# Patient Record
Sex: Male | Born: 1970 | Race: White | Hispanic: No | Marital: Single | Smoking: Current every day smoker
Health system: Southern US, Community
[De-identification: ages and names within clinical notes are randomized; demographics above are authoritative.]

## PROBLEM LIST (undated history)

## (undated) DIAGNOSIS — S32009A Unspecified fracture of unspecified lumbar vertebra, initial encounter for closed fracture: Secondary | ICD-10-CM

## (undated) NOTE — Progress Notes (Signed)
 Formatting of this note is different from the original. Medical record reviewed. CM met with the pt to complete assessment. The pt lives with his significant other and reports complete independence in all activities. Prior to admission he required no assistive devices for ambulation. The pt is form the [place], [place] area and prefers to follow up with Ortho there. CM provided a list of Ortho physician's in his local area under [facility] umbrella, explained he may be required to provide payment at time of appointment. CM could not make contact today, offices closed. Encouraged pt to call ASAP on his return to his local area to obtain an appointment. If he is unsuccessful he has been instructed to contact Dr. Catha office for follow up.  The pt's films will be placed on a CD and medical information provided for the physician in his local area. Awaiting brace and ambulation prior to discharge.   08/20/15 1459  Discharge Assessment  Discharge Screening No identified indicators  Discharge Screening Result Case Management face to face assessment recommended  Support Systems Family members;Friends/neighbors;Spouse/significant other  Primary Caregiver/Support Name [facility]  Primary Caregiver/Support Phone Number [phone]  Lives With Significant other  Type of Residence Private residence  Baseline Walking Independent  Baseline Wheelchair mobility Not applicable  Baseline Cognition Intact  Baseline Mode of Retail banker None  Home Bathroom Equipment None  Services Prior to Admission None  Patient Discharge Goal/Expectation Home-patient/family capable  Case Management Face to Face Assessment Complete  (FOR CASE MANAGEMENT STAFF ONLY) Yes  Case Management Financial Assessment Uninsured   Jerel Gaudier RN, MSN, [place] Trauma Nurse Case Manager [phone]  Electronically signed by Jerel Jama Rhett Sharlie, Case Manager at 08/20/2015  3:06 PM EDT
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
## (undated) NOTE — Progress Notes (Signed)
 Formatting of this note might be different from the original. INTERVAL PROGRESS NOTE:  Patient asking to be discharged to home. Evaluated by Spine Surgery and deemed appropriate for discharge with brace. Patient was ambulatory with brace with nursing staff with minimal assistance. His pain was well controlled with flexeril and oxycodone  and he was stable for discharge.  Deward Presto, MD, PGY-1 General Surgery 08/20/2015 17:12 Pager: [phone] Serve [phone]  Cosigned by Elsie FALCON. Powers IV, MD at 08/21/2015  6:33 PM EDT Electronically signed by Deward Butler Presto, MD at 08/20/2015  5:12 PM EDT Electronically signed by Elsie FALCON. Powers IV, MD at 08/21/2015  6:33 PM EDT
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
## 2002-04-20 ENCOUNTER — Encounter: Payer: Self-pay | Admitting: Emergency Medicine

## 2002-04-20 ENCOUNTER — Emergency Department (HOSPITAL_COMMUNITY): Admission: EM | Admit: 2002-04-20 | Discharge: 2002-04-20 | Payer: Self-pay | Admitting: Emergency Medicine

## 2015-09-05 ENCOUNTER — Emergency Department (HOSPITAL_COMMUNITY): Payer: Self-pay

## 2015-09-05 ENCOUNTER — Emergency Department (HOSPITAL_COMMUNITY)
Admission: EM | Admit: 2015-09-05 | Discharge: 2015-09-05 | Disposition: A | Discharge status: Home / Self Care | Payer: Self-pay | Attending: Emergency Medicine | Admitting: Emergency Medicine

## 2015-09-05 ENCOUNTER — Encounter (HOSPITAL_COMMUNITY): Payer: Self-pay | Admitting: Nurse Practitioner

## 2015-09-05 DIAGNOSIS — S32059D Unspecified fracture of fifth lumbar vertebra, subsequent encounter for fracture with routine healing: Secondary | ICD-10-CM

## 2015-09-05 DIAGNOSIS — F17200 Nicotine dependence, unspecified, uncomplicated: Secondary | ICD-10-CM | POA: Insufficient documentation

## 2015-09-05 HISTORY — DX: Unspecified fracture of unspecified lumbar vertebra, initial encounter for closed fracture: S32.009A

## 2015-09-05 MED ORDER — OXYCODONE-ACETAMINOPHEN 5-325 MG PO TABS
1.0000 | ORAL_TABLET | Freq: Once | ORAL | Status: AC
  Administered 2015-09-05: 1 via ORAL
  Filled 2015-09-05: qty 1

## 2015-09-05 MED ORDER — METHOCARBAMOL 500 MG PO TABS
1000.0000 mg | ORAL_TABLET | Freq: Once | ORAL | Status: AC
  Administered 2015-09-05: 1000 mg via ORAL
  Filled 2015-09-05: qty 2

## 2015-09-05 MED ORDER — OXYCODONE-ACETAMINOPHEN 5-325 MG PO TABS: 1.0000 | ORAL_TABLET | Freq: Three times a day (TID) | ORAL | Status: DC | PRN

## 2015-09-05 NOTE — ED Notes (Signed)
Patient transported to CT 
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
## 2015-09-05 NOTE — Discharge Instructions (Signed)
Take pain medication only as directed - This can make you very drowsy - please do not drink alcohol, operate heavy machinery or drive on this medication.  Return to ER for numbness of lower extremity, fever, new or worsening symptoms, any additional concerns.

## 2015-09-05 NOTE — ED Notes (Signed)
PA at bedside.

## 2017-06-16 ENCOUNTER — Emergency Department (HOSPITAL_COMMUNITY)
Admission: EM | Admit: 2017-06-16 | Discharge: 2017-06-16 | Disposition: A | Discharge status: Home / Self Care | Payer: Self-pay | Attending: Emergency Medicine | Admitting: Emergency Medicine

## 2017-06-16 ENCOUNTER — Encounter (HOSPITAL_COMMUNITY): Payer: Self-pay | Admitting: Emergency Medicine

## 2017-06-16 DIAGNOSIS — Y929 Unspecified place or not applicable: Secondary | ICD-10-CM | POA: Insufficient documentation

## 2017-06-16 DIAGNOSIS — Z23 Encounter for immunization: Secondary | ICD-10-CM | POA: Insufficient documentation

## 2017-06-16 DIAGNOSIS — F17200 Nicotine dependence, unspecified, uncomplicated: Secondary | ICD-10-CM | POA: Insufficient documentation

## 2017-06-16 DIAGNOSIS — Y998 Other external cause status: Secondary | ICD-10-CM | POA: Insufficient documentation

## 2017-06-16 DIAGNOSIS — W458XXA Other foreign body or object entering through skin, initial encounter: Secondary | ICD-10-CM | POA: Insufficient documentation

## 2017-06-16 DIAGNOSIS — S6990XA Unspecified injury of unspecified wrist, hand and finger(s), initial encounter: Secondary | ICD-10-CM | POA: Insufficient documentation

## 2017-06-16 DIAGNOSIS — Y9389 Activity, other specified: Secondary | ICD-10-CM | POA: Insufficient documentation

## 2017-06-16 MED ORDER — LIDOCAINE HCL 2 % IJ SOLN
5.0000 mL | Freq: Once | INTRAMUSCULAR | Status: AC
  Administered 2017-06-16: 100 mg via INTRADERMAL
  Filled 2017-06-16: qty 20

## 2017-06-16 MED ORDER — CEPHALEXIN 500 MG PO CAPS: 500.0000 mg | ORAL_CAPSULE | Freq: Four times a day (QID) | ORAL | 0 refills | Status: AC

## 2017-06-16 MED ORDER — TETANUS-DIPHTH-ACELL PERTUSSIS 5-2.5-18.5 LF-MCG/0.5 IM SUSP
0.5000 mL | Freq: Once | INTRAMUSCULAR | Status: AC
  Administered 2017-06-16: 0.5 mL via INTRAMUSCULAR
  Filled 2017-06-16: qty 0.5

## 2017-06-16 NOTE — ED Provider Notes (Signed)
[facility]-EMERGENCY DEPT Provider Note   CSN: [id number] Arrival date & time: 06/16/17  2104     History   Chief Complaint No chief complaint on file.   HPI Chad Greene is a 47 y.o. male.  HPI   Pt is a 47 year old male who presents the ED today complaining of a fishhook stuck into his left ring finger that happened about 4-5 hours ago.  States he attempted to remove it prior to arrival with no success.  States he cut the end of the fishhook off.  Does have decreased sensation to the distal part of the left index finger.  No active bleeding.  States he has good range of motion of the finger.  States pain 8/10 and constant.  Has not tried taking any pain medication prior to arrival.  No other injuries noted and no other symptoms. No h/o DM.  Past Medical History:  Diagnosis Date  . Lumbar vertebral fracture (HCC)     There are no active problems to display for this patient.   History reviewed. No pertinent surgical history.      Home Medications    Prior to Admission medications   Medication Sig Start Date End Date Taking? Authorizing Provider  cephALEXin (KEFLEX) 500 MG capsule Take 1 capsule (500 mg total) by mouth 4 (four) times daily for 5 days. 06/16/17 06/21/17  Ayrianna Mcginniss S, PA-C  ibuprofen (ADVIL,MOTRIN) 200 MG tablet Take 800 mg by mouth every 6 (six) hours as needed for moderate pain.    [provider]  oxyCODONE-acetaminophen (PERCOCET/ROXICET) 5-325 MG tablet Take 1 tablet by mouth every 8 (eight) hours as needed for severe pain. 09/05/15   Ward, Chase PicketJaime Pilcher, PA-C    Family History No family history on file.  Social History Social History   Tobacco Use  . Smoking status: Current Some Day Smoker  Substance Use Topics  . Alcohol use: Yes  . Drug use: No     Allergies   Patient has no known allergies.   Review of Systems Review of Systems  Constitutional: Negative for fever.  Musculoskeletal:       Finger  pain  Skin:       Fishhook in left ring finger     Physical Exam Updated Vital Signs BP 130/88 (BP Location: Right Arm)   Pulse 88   Temp 98.3 F (36.8 C) (Oral)   Resp 17   SpO2 98%   Physical Exam  Constitutional: He is oriented to person, place, and time. He appears well-developed and well-nourished. No distress.  Eyes: Conjunctivae are normal.  Cardiovascular: Normal rate.  Pulmonary/Chest: Effort normal.  Musculoskeletal:  Patient took 2 distal portion of left ring finger.  Brisk cap refill.  Flexion extension intact at the DIP, PIP and MCP joints.  Does report decreased sensation to the distal portion of the left ring finger.  No active bleeding.  Neurological: He is alert and oriented to person, place, and time.  Skin: Skin is warm and dry.     ED Treatments / Results  Labs (all labs ordered are listed, but only abnormal results are displayed) Labs Reviewed - No data to display  EKG None  Radiology No results found.  Procedures .Nerve Block Date/Time: 06/17/2017 12:46 AM Performed by: Karrie Meresouture, Sharonica Kraszewski S, PA-C Authorized by: Karrie Meresouture, Tanieka Pownall S, PA-C   Consent:    Consent obtained:  Verbal   Consent given by:  Patient   Risks discussed:  Infection and nerve damage  Location:    Nerve block body site: finger.   Laterality:  Left Pre-procedure details:    Skin preparation:  2% chlorhexidine Skin anesthesia (see MAR for exact dosages):    Skin anesthesia method:  Local infiltration Procedure details (see MAR for exact dosages):    Block needle gauge:  25 G   Anesthetic injected:  Lidocaine 2% w/o epi Post-procedure details:    Outcome:  Pain relieved   Patient tolerance of procedure:  Tolerated well, no immediate complications   (including critical care time)  Medications Ordered in ED Medications  lidocaine (XYLOCAINE) 2 % (with pres) injection 100 mg (100 mg Intradermal Given 06/16/17 2345)  Tdap (BOOSTRIX) injection 0.5 mL (0.5 mLs Intramuscular Given  06/16/17 2344)     Initial Impression / Assessment and Plan / ED Course  I have reviewed the triage vital signs and the nursing notes.  Pertinent labs & imaging results that were available during my care of the patient were reviewed by me and considered in my medical decision making (see chart for details).    Discussed pt presentation and exam findings with Dr. Ethelle Lyon who personally evaluated the patient and agrees with the plan for discharge on Keflex.   Final Clinical Impressions(s) / ED Diagnoses   Final diagnoses:  Fish hook injury of finger, unspecified laterality, initial encounter   Patient presenting with fishhook stuck in left ring finger.  Digital block performed to the finger and fishhook was removed via push through technique.  Patient already caught fishhook prior to arrival.  Area was cleansed with normal saline and dressed.  Patient was placed on Keflex and advised to follow-up with PCP in a week for reevaluation.  Advised to return for any signs of infection.  Tdap was updated.  All questions answered and patient understands plan and reasons to return.  ED Discharge Orders        Ordered    cephALEXin (KEFLEX) 500 MG capsule  4 times daily     06/16/17 2332       Karrie Meres, PA-C 06/17/17 0049    Cathren Laine, MD 06/21/17 [phone]

## 2017-06-16 NOTE — Discharge Instructions (Addendum)
You were given a prescription for antibiotics. Please take the antibiotic prescription fully.   I have prescribed a new medication for you today. It is important that when you pick the prescription up you discuss the potential interactions of this medication with other medications you are taking, including over the counter medications, with the pharmacists.   This new medication has potential side effects. Be sure to contact your primary care provider or return to the emergency department if you are experiencing new symptoms that you are unable to tolerate after starting the medication. You need to receive medical evaluation immediately if you start to experience blistering of the skin, rash, swelling, or difficulty breathing as these signs could indicate a more serious medication side effect.   Please follow up with your primary care doctor in 1 week for recheck of your wound. Please return to the emergency room immediately if you experience any new or worsening symptoms or any symptoms that indicate worsening infection such as fevers, increased redness/swelling/pain, warmth, or drainage from the affected area.

## 2017-06-16 NOTE — ED Triage Notes (Signed)
Patient reports fishing hook stuck in left ring finger x4 hours. Reports attempting to remove PTA with no success. Movement and sensation to finger.

## 2018-03-07 IMAGING — CT CT L SPINE W/O CM
3 series · 12 of 33 positions shown, 14 images · non-contrast
Comparison: X-ray from earlier today

CLINICAL DATA: Known L5 fracture.  Pain.

EXAM:
CT LUMBAR SPINE WITHOUT CONTRAST
TECHNIQUE: Multidetector CT imaging of the lumbar spine was performed without
intravenous contrast administration. Multiplanar CT image
reconstructions were also generated.

[Series 202: soft tissue, idose (2) · axial · 0.45mm/px · z∈[+390,+614]mm · 4 of 164 slices shown, 5 images]
[im 26/164  soft-tissue]
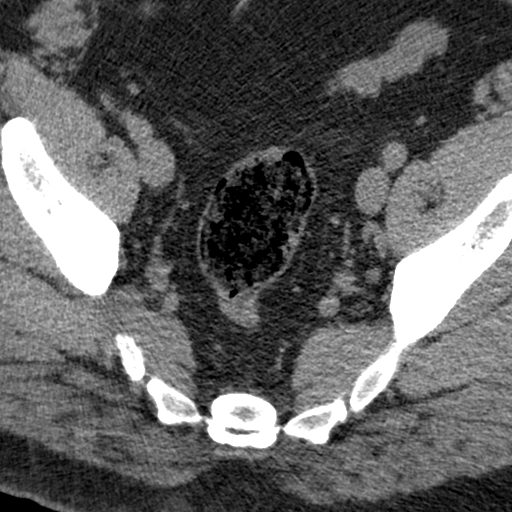
[im 26/164  bone]
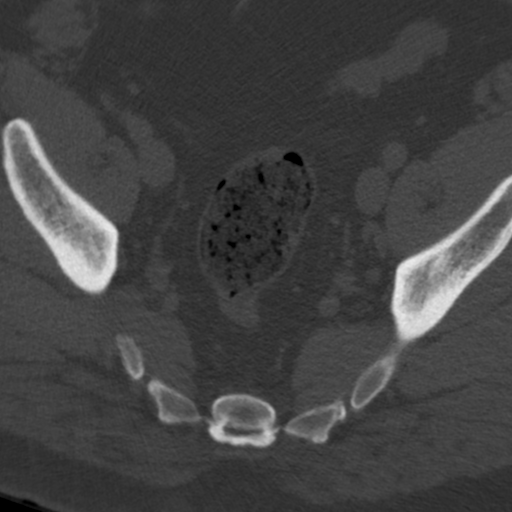
[im 63/164  bone]
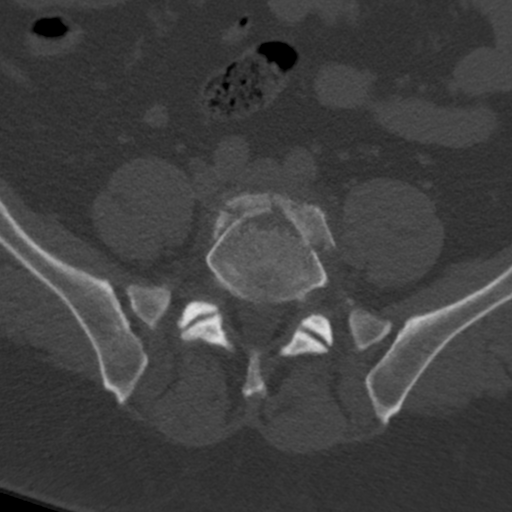
[im 101/164  bone]
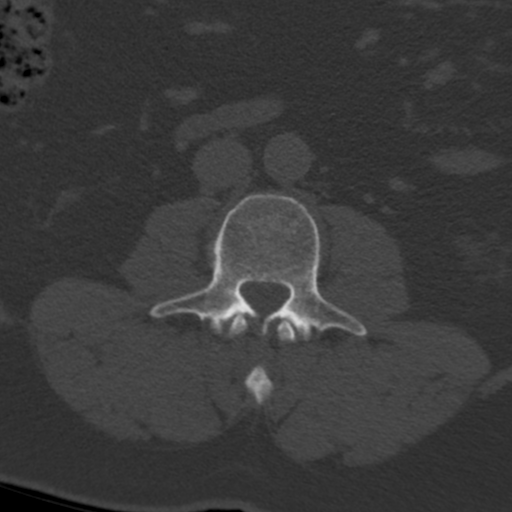
[im 138/164  bone]
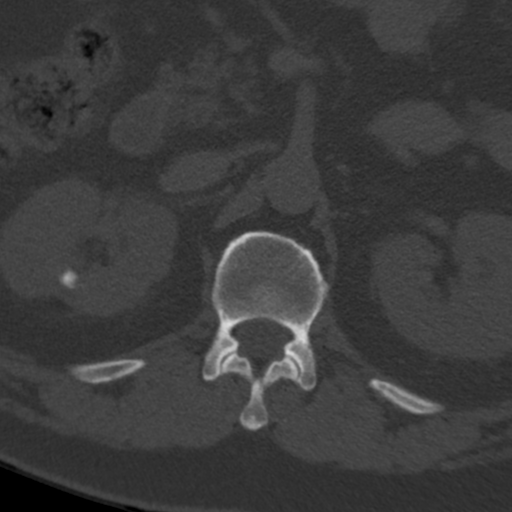

[Series 204: sagittal, idose (2) · sagittal · 0.45mm/px · 5 of 113 slices shown, 6 images]
[im 38/113  bone]
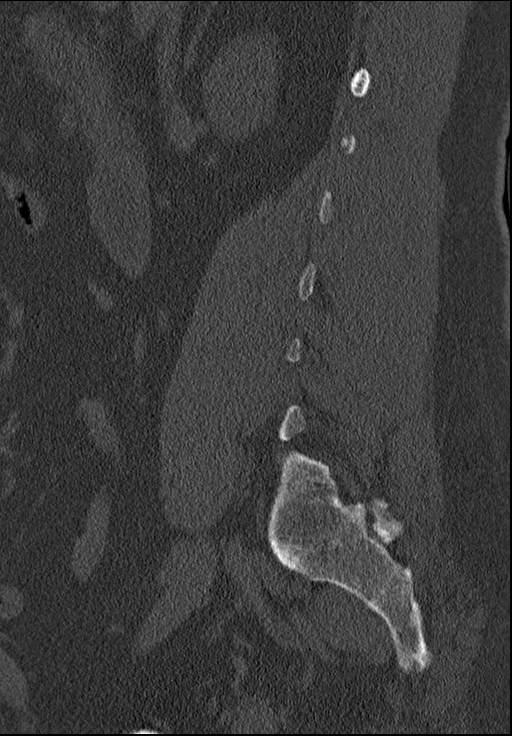
[im 47/113  bone]
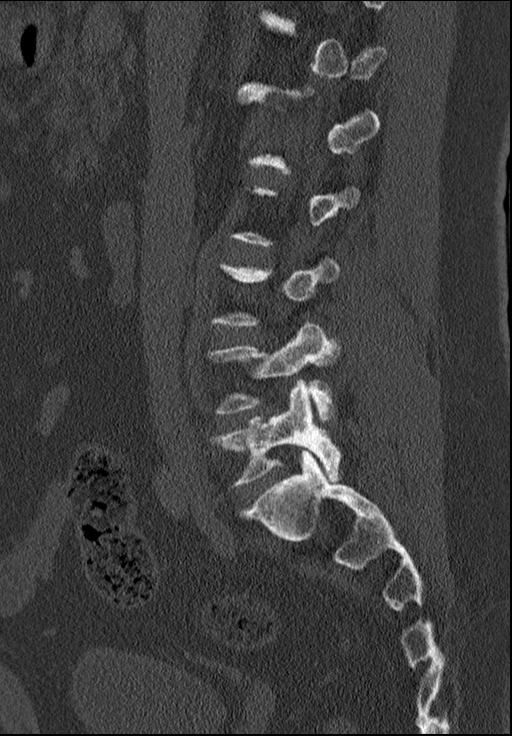
[im 57/113  soft-tissue]
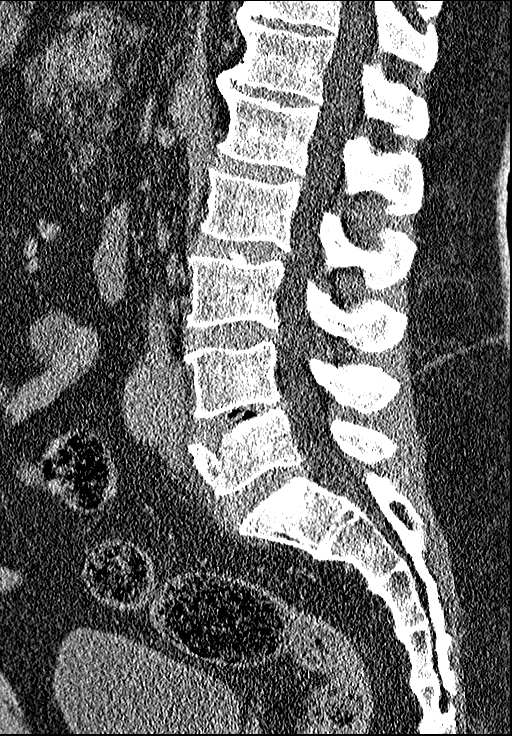
[im 57/113  bone]
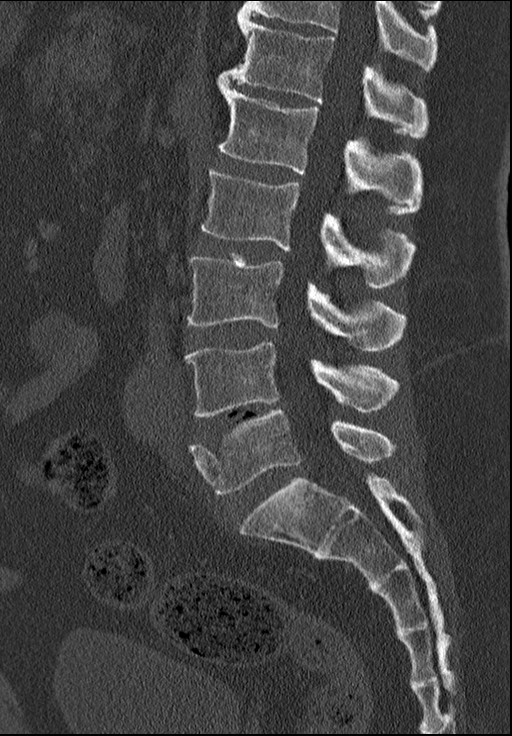
[im 66/113  bone]
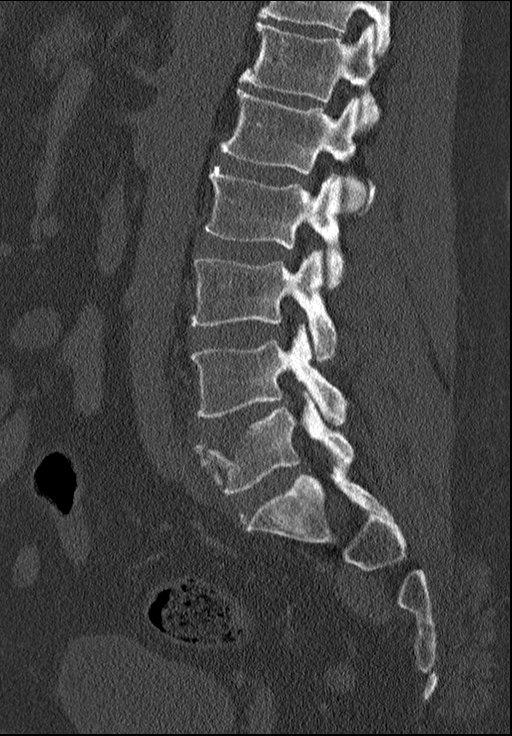
[im 75/113  bone]
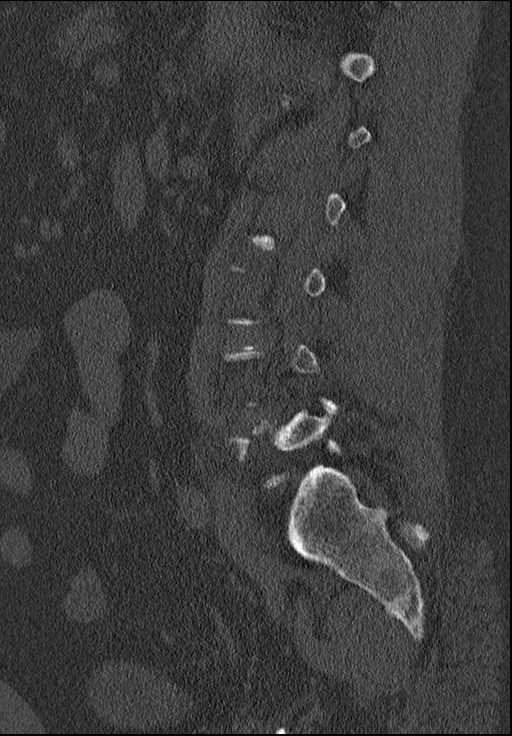

[Series 205: coronal, idose (2) · coronal · 0.45mm/px · 3 of 113 slices shown]
[im 23/113  bone]
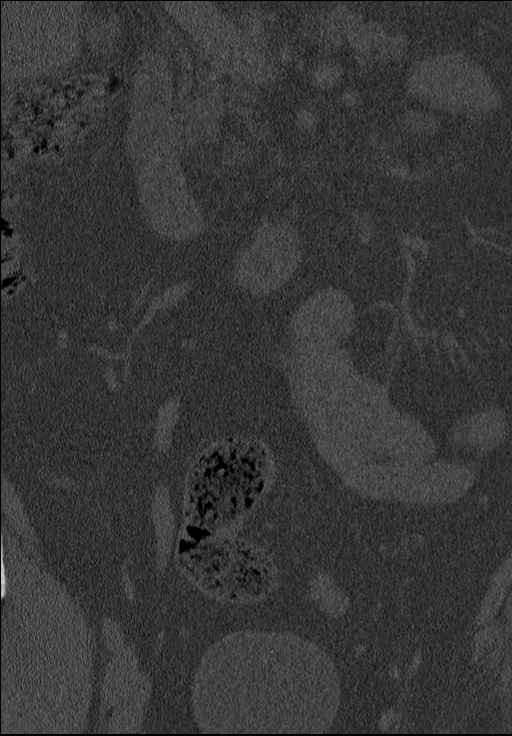
[im 45/113  bone]
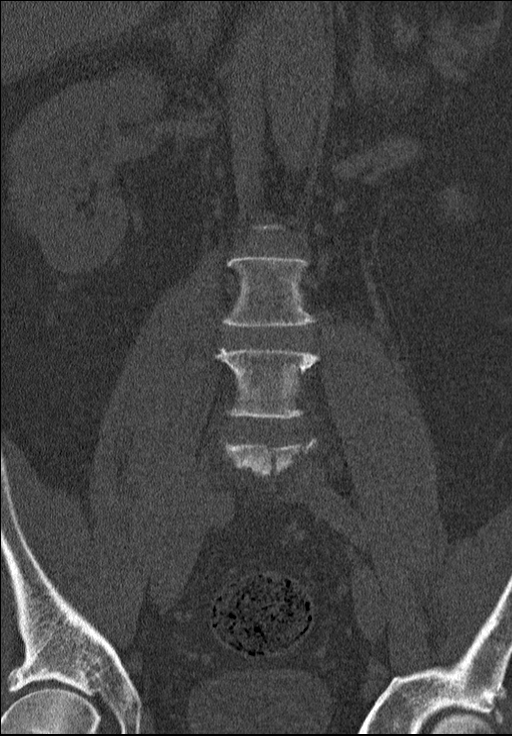
[im 68/113  bone]
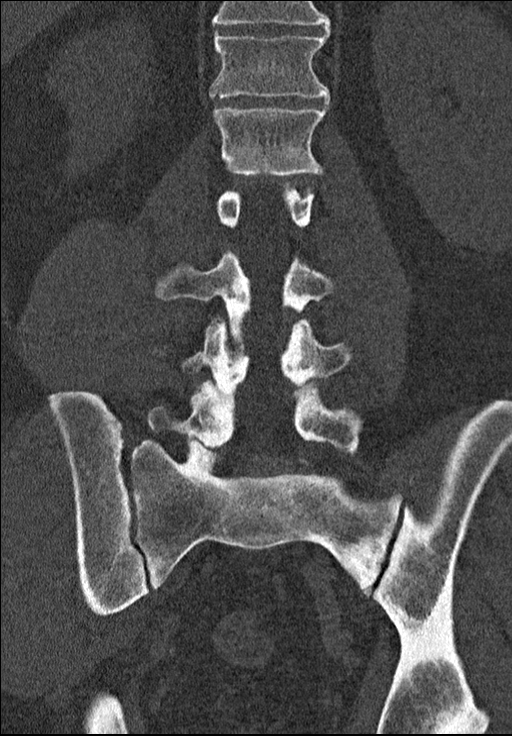

[12 of 33 positions shown; findings below may reference images not displayed]

FINDINGS: There is a complex comminuted fracture of the L5 vertebral body
which is located in the anterior half of the vertebral body with
mild displacement of fracture fragments. The fracture does not
extend to the posterior aspect of the vertebral body and there is no
extension into the posterior elements. Gas in the L4-5 disc space is
likely posttraumatic vacuum disc phenomena. No other fracture seen
in the lumbar spine. Degenerative changes seen at multiple levels
most prominent at T11-12 and T12-L1. There is also a possible
nondisplaced fracture through the left side of the sacrum.

No neural foraminal narrowing seen on this study.

There is a nonobstructive stone in the right kidney measuring 10 mm.
There is a low-attenuation mass in the left adrenal gland most
consistent with an adenoma.
IMPRESSION: 1. Comminuted fracture of the anterior half of the L5 vertebral body
with no extension into the posterior elements and no significant
malalignment. Posttraumatic vacuum disc phenomena seen at L4-5.
2. Possible subtle nondisplaced fracture through the left side of
the sacrum.
3. Nonobstructive right renal stone.
4. Left adrenal nodule, likely an adenoma.

## 2019-01-15 ENCOUNTER — Other Ambulatory Visit: Payer: Self-pay

## 2019-01-15 DIAGNOSIS — Z20822 Contact with and (suspected) exposure to covid-19: Secondary | ICD-10-CM

## 2019-01-16 LAB — NOVEL CORONAVIRUS, NAA: SARS-CoV-2, NAA: NOT DETECTED

## 2019-06-27 ENCOUNTER — Ambulatory Visit: Payer: Self-pay | Attending: Internal Medicine

## 2019-06-27 DIAGNOSIS — Z23 Encounter for immunization: Secondary | ICD-10-CM

## 2019-06-27 NOTE — Progress Notes (Signed)
   Covid-19 Vaccination Clinic  Name:  Chad Greene    MRN: [id number] DOB: 1970-08-18  06/27/2019  [name] was observed post Covid-19 immunization for 15 minutes without incident. He was provided with Vaccine Information Sheet and instruction to access the V-Safe system.   [name] was instructed to call 911 with any severe reactions post vaccine: Marland Kitchen Difficulty breathing  . Swelling of face and throat  . A fast heartbeat  . A bad rash all over body  . Dizziness and weakness   Immunizations Administered    Name Date Dose VIS Date Route   Pfizer COVID-19 Vaccine 06/27/2019  9:33 AM 0.3 mL 02/20/2019 Intramuscular   Manufacturer: [employer], [employer]   Lot: [id number]   NDC: [address]-7

## 2019-07-21 ENCOUNTER — Ambulatory Visit: Payer: Self-pay

## 2019-07-25 ENCOUNTER — Ambulatory Visit: Payer: Self-pay | Attending: Internal Medicine

## 2019-07-25 DIAGNOSIS — Z23 Encounter for immunization: Secondary | ICD-10-CM

## 2019-07-25 NOTE — Progress Notes (Signed)
   Covid-19 Vaccination Clinic  Name:  Chad Greene    MRN: [id number] DOB: 1971/01/26  07/25/2019  [name] was observed post Covid-19 immunization for 15 minutes without incident. He was provided with Vaccine Information Sheet and instruction to access the V-Safe system.   [name] was instructed to call 911 with any severe reactions post vaccine: Marland Kitchen Difficulty breathing  . Swelling of face and throat  . A fast heartbeat  . A bad rash all over body  . Dizziness and weakness   Immunizations Administered    Name Date Dose VIS Date Route   Pfizer COVID-19 Vaccine 07/25/2019  9:41 AM 0.3 mL 05/06/2018 Intramuscular   Manufacturer: [employer], [employer]   Lot: [id number]   NDC: [address]-5

## 2019-11-10 ENCOUNTER — Emergency Department (HOSPITAL_COMMUNITY)
Admission: EM | Admit: 2019-11-10 | Discharge: 2019-11-10 | Disposition: A | Discharge status: Home / Self Care | Payer: Self-pay | Attending: Emergency Medicine | Admitting: Emergency Medicine

## 2019-11-10 ENCOUNTER — Emergency Department (HOSPITAL_COMMUNITY): Payer: Self-pay

## 2019-11-10 ENCOUNTER — Other Ambulatory Visit: Payer: Self-pay

## 2019-11-10 ENCOUNTER — Encounter (HOSPITAL_COMMUNITY): Payer: Self-pay

## 2019-11-10 DIAGNOSIS — F17210 Nicotine dependence, cigarettes, uncomplicated: Secondary | ICD-10-CM | POA: Insufficient documentation

## 2019-11-10 DIAGNOSIS — R1011 Right upper quadrant pain: Secondary | ICD-10-CM

## 2019-11-10 DIAGNOSIS — R109 Unspecified abdominal pain: Secondary | ICD-10-CM

## 2019-11-10 DIAGNOSIS — R519 Headache, unspecified: Secondary | ICD-10-CM | POA: Insufficient documentation

## 2019-11-10 LAB — COMPREHENSIVE METABOLIC PANEL
ALT: 33 U/L (ref 0–44)
AST: 20 U/L (ref 15–41)
Albumin: 4.3 g/dL (ref 3.5–5.0)
Alkaline Phosphatase: 76 U/L (ref 38–126)
Anion gap: 10 (ref 5–15)
BUN: 10 mg/dL (ref 6–20)
CO2: 25 mmol/L (ref 22–32)
Calcium: 9.1 mg/dL (ref 8.9–10.3)
Chloride: 103 mmol/L (ref 98–111)
Creatinine, Ser: 0.79 mg/dL (ref 0.61–1.24)
GFR calc Af Amer: >60 mL/min (ref >60)
GFR calc non Af Amer: >60 mL/min (ref >60)
Glucose, Bld: 99 mg/dL (ref 70–99)
Potassium: 3.8 mmol/L (ref 3.5–5.1)
Sodium: 138 mmol/L (ref 135–145)
Total Bilirubin: 0.5 mg/dL (ref 0.3–1.2)
Total Protein: 7.8 g/dL (ref 6.5–8.1)

## 2019-11-10 LAB — CBC
HCT: 46.3 % (ref 39.0–52.0)
Hemoglobin: 15.4 g/dL (ref 13.0–17.0)
MCH: 30.8 pg (ref 26.0–34.0)
MCHC: 33.3 g/dL (ref 30.0–36.0)
MCV: 92.6 fL (ref 80.0–100.0)
Platelets: 241 10*3/uL (ref 150–400)
RBC: 5 MIL/uL (ref 4.22–5.81)
RDW: 12.7 % (ref 11.5–15.5)
WBC: 8.1 10*3/uL (ref 4.0–10.5)
nRBC: 0 % (ref 0.0–0.2)

## 2019-11-10 LAB — RAPID URINE DRUG SCREEN, HOSP PERFORMED
Amphetamines: POSITIVE — AB
Barbiturates: NOT DETECTED
Benzodiazepines: NOT DETECTED
Cocaine: POSITIVE — AB
Opiates: NOT DETECTED
Tetrahydrocannabinol: POSITIVE — AB

## 2019-11-10 LAB — ETHANOL: Alcohol, Ethyl (B): <10 mg/dL (ref <10)

## 2019-11-10 NOTE — Discharge Instructions (Signed)
Some of the ducts in your liver were mildly larger than normal.  Follow-up with a gastroenterologist for this.  Also follow with Dr. Mikeal Hawthorne, your PCP, for further evaluation.

## 2019-11-10 NOTE — ED Triage Notes (Addendum)
Per EMS- Patient states that he has a worm in his eye and has been digging in his nose and eye trying to make the worm come out. Patient has multiple complaints I. feet pain, eye pain, headache, etc. Patient gets agitated if anyone doubts that he has a worm in his right eye.  Patent denies SI/HI, visual or auditory or visual hallucinations  During triage the patient c/o having chest pain x 2 days

## 2019-11-10 NOTE — ED Provider Notes (Signed)
[facility]-EMERGENCY DEPT Provider Note   CSN: [id number] Arrival date & time: 11/10/19  1532     History Chief Complaint  Patient presents with  .   Eye complaint itching and abdominal pain    Chad Greene is a 49 y.o. male.  HPI Patient presents with a few different points.  Feels as if there is something around his right eye.  States it has been there for a few days.  States that headaches.  States he is got bumps that come up on his body.  States his fingers burn.  States at times his abdomen hurts.  No fevers.  No hallucinations.  States he did feel something that he can move around on his eyelid.  States he is also moved down to the front of his face.  Denies vision changes.  At times states he also has severe abdominal pain.  Comes and goes.  Not associated with eating.  States he gets bumps on his hands and other parts of his body too. Patient states that when he was in jail he was told he had gallstones.    Past Medical History:  Diagnosis Date  . Lumbar vertebral fracture (HCC)     There are no problems to display for this patient.   History reviewed. No pertinent surgical history.     History reviewed. No pertinent family history.  Social History   Tobacco Use  . Smoking status: Current Some Day Smoker    Types: Cigarettes  . Smokeless tobacco: Former Clinical biochemist  . Vaping Use: Every day  . Substances: Nicotine, Flavoring  Substance Use Topics  . Alcohol use: Yes  . Drug use: No    Home Medications Prior to Admission medications   Medication Sig Start Date End Date Taking? Authorizing Provider  ibuprofen (ADVIL,MOTRIN) 200 MG tablet Take 800 mg by mouth every 6 (six) hours as needed for moderate pain.    [provider]  oxyCODONE-acetaminophen (PERCOCET/ROXICET) 5-325 MG tablet Take 1 tablet by mouth every 8 (eight) hours as needed for severe pain. 09/05/15   Ward, Chase Picket, PA-C    Allergies    Patient  has no known allergies.  Review of Systems   Review of Systems  Constitutional: Negative for appetite change.  HENT: Negative for congestion.   Respiratory: Negative for shortness of breath.   Cardiovascular: Negative for chest pain.  Gastrointestinal: Positive for abdominal pain.  Genitourinary: Negative for genital sores.  Musculoskeletal: Negative for back pain.  Skin: Positive for wound.  Neurological: Positive for headaches. Negative for weakness.  Psychiatric/Behavioral: Negative for confusion.    Physical Exam Updated Vital Signs BP (!) 138/99 (BP Location: Left Arm)   Pulse 98   Temp 98.2 F (36.8 C) (Oral)   Resp 18   Ht 6' (1.829 m)   Wt (!) 137.9 kg   SpO2 100%   BMI 41.23 kg/m   Physical Exam Vitals and nursing note reviewed.  HENT:     Head: Normocephalic.  Eyes:     General: No scleral icterus.       Right eye: No discharge.        Left eye: No discharge.     Extraocular Movements: Extraocular movements intact.     Pupils: Pupils are equal, round, and reactive to light.     Comments: Mild swelling of glands on upper lid.  Cardiovascular:     Rate and Rhythm: Regular rhythm.  Pulmonary:  Breath sounds: No wheezing or rhonchi.  Abdominal:     Tenderness: There is no abdominal tenderness.  Musculoskeletal:     Cervical back: Neck supple.  Skin:    Capillary Refill: Capillary refill takes less than 2 seconds.     Findings: No rash.     Comments: No clear bumps or rashes seen at the areas where he points.  States he can feel them in his skin.  I palpate where he is pointed at did not feel bumps.  Neurological:     Mental Status: He is alert and oriented to person, place, and time.     ED Results / Procedures / Treatments   Labs (all labs ordered are listed, but only abnormal results are displayed) Labs Reviewed  RAPID URINE DRUG SCREEN, HOSP PERFORMED - Abnormal; Notable for the following components:      Result Value   Cocaine POSITIVE (*)      Amphetamines POSITIVE (*)    Tetrahydrocannabinol POSITIVE (*)    All other components within normal limits  COMPREHENSIVE METABOLIC PANEL  ETHANOL  CBC    EKG EKG Interpretation  Date/Time:  Tuesday November 10 2019 16:11:51 EDT Ventricular Rate:  89 PR Interval:    QRS Duration: 109 QT Interval:  365 QTC Calculation: 445 R Axis:   47 Text Interpretation: Sinus rhythm Probable left ventricular hypertrophy 12 Lead; Mason-Likar Confirmed by Benjiman Core [phone]) on 11/10/2019 4:21:19 PM   Radiology DG Chest 2 View  Result Date: 11/10/2019 CLINICAL DATA:  Chest pain EXAM: CHEST - 2 VIEW COMPARISON:  No pertinent prior exams are available for comparison. FINDINGS: Heart size within normal limits. No appreciable airspace consolidation or pulmonary edema. Symmetric nipple shadows project over the lung bases. No evidence of pleural effusion or pneumothorax. No acute bony abnormality is identified. Thoracic spondylosis. IMPRESSION: No evidence of acute cardiopulmonary abnormality. Electronically Signed   By: Jackey Loge DO   On: 11/10/2019 17:57   US Abdomen Limited RUQ  Result Date: 11/10/2019 CLINICAL DATA:  Right upper quadrant pain EXAM: ULTRASOUND ABDOMEN LIMITED RIGHT UPPER QUADRANT COMPARISON:  CT L-spine 09/05/2015 FINDINGS: Gallbladder: Some echogenic biliary sludge noted within the gallbladder lumen. No visible shadowing gallstones. Wall thickness at 2.5 mm is within normal limits. No pericholecystic fluid. Sonographic Eulah Pont sign is reportedly negative. Common bile duct: Diameter: 9.5 mm, dilated.  No visible intraductal gallstones. Liver: Diffusely increased hepatic echogenicity with loss of definition of the portal triads and diminished posterior through transmission compatible with hepatic steatosis. No focal liver lesion. Portal vein is patent on color Doppler imaging with normal direction of blood flow towards the liver. Other: Technically challenging exam due to patient's  body habitus as well as diminished through transmission secondary to hepatic steatosis. IMPRESSION: Biliary sludge within the gallbladder without visible cholelithiasis or evidence of acute cholecystitis. Biliary ductal dilatation, nonspecific. Choledocholithiasis is not fully excluded. Recommend correlation with lab values and exam findings and if there is clinical concern for biliary obstruction or choledocholithiasis, MRCP could be obtained. Hepatic steatosis. Technically challenging exam due to patient body habitus and diminished through transmission secondary to hepatic steatosis. Electronically Signed   By: Kreg Shropshire M.D.   On: 11/10/2019 17:40    Procedures Procedures (including critical care time)  Medications Ordered in ED Medications - No data to display  ED Course  I have reviewed the triage vital signs and the nursing notes.  Pertinent labs & imaging results that were available during my care of the patient  were reviewed by me and considered in my medical decision making (see chart for details).    MDM Rules/Calculators/A&P                          Patient presents with complaints.  I do not feel the lump that he feels moving around.  Also states that he has itching and bumps.  I do not see these.  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  These easily could be the pruritus.  Abdominal exam is benign.  Does have gallbladder sludge.  Biliary tract is mildly dilated.  Gallbladder wall is not thick.  LFTs are normal however.  Will have follow-up with GI and PCP.  Will discharge home. Final Clinical Impression(s) / ED Diagnoses Final diagnoses:  Abdominal pain, unspecified abdominal location    Rx / DC Orders ED Discharge Orders    None       Benjiman Core, MD 11/10/19 1826

## 2019-11-10 NOTE — ED Notes (Signed)
Pt left before receiving paperwork or getting discharge vital signs

## 2021-08-25 ENCOUNTER — Ambulatory Visit
Admission: EM | Admit: 2021-08-25 | Discharge: 2021-08-25 | Disposition: A | Discharge status: Home / Self Care | Payer: Self-pay | Attending: Internal Medicine | Admitting: Internal Medicine

## 2021-08-25 DIAGNOSIS — Z113 Encounter for screening for infections with a predominantly sexual mode of transmission: Secondary | ICD-10-CM | POA: Insufficient documentation

## 2021-08-25 DIAGNOSIS — L0291 Cutaneous abscess, unspecified: Secondary | ICD-10-CM | POA: Insufficient documentation

## 2021-08-25 DIAGNOSIS — Z202 Contact with and (suspected) exposure to infections with a predominantly sexual mode of transmission: Secondary | ICD-10-CM | POA: Insufficient documentation

## 2021-08-25 MED ORDER — METRONIDAZOLE 500 MG PO TABS
2000.0000 mg | ORAL_TABLET | Freq: Once | ORAL | Status: AC
  Administered 2021-08-25: 2000 mg via ORAL

## 2021-08-25 MED ORDER — DOXYCYCLINE HYCLATE 100 MG PO CAPS: 100.0000 mg | ORAL_CAPSULE | Freq: Two times a day (BID) | ORAL | 0 refills | Status: DC

## 2021-08-25 MED ORDER — ERYTHROMYCIN 5 MG/GM OP OINT: TOPICAL_OINTMENT | OPHTHALMIC | 0 refills | Status: DC

## 2021-08-25 NOTE — ED Triage Notes (Signed)
Pt c/o skin "bumps" around eye and chest. Also states exposed to possible trich and wants sti screening.

## 2021-08-25 NOTE — ED Provider Notes (Addendum)
[facility]    CSN: [id number] Arrival date & time: 08/25/21  1741      History   Chief Complaint Chief Complaint  Patient presents with   face thing and sti    HPI Chad Greene is a 50 y.o. male.   Patient presents with multiple different lesions present to right arm, face, abdomen, chest that have been present for multiple months.  Patient has been seen by dermatology about 6 months ago when symptoms first started and was prescribed three months of antibiotics with some improvement.  Patient reports that it has recently flared back up.  Patient has noticed some purulent drainage from areas as well.  Denies fever, body aches, chills.  Patient is concerned about the one overlying left eyebrow that is extending into left eyelid.  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  Patient is also concerned for possible trichomoniasis exposure.  Patient reports that he thinks that he was exposed to trichomoniasis by recent sexual partner.  Denies any associated symptoms including penile discharge, testicular pain, back pain, fever, abdominal pain.     Past Medical History:  Diagnosis Date   Lumbar vertebral fracture (HCC)     There are no problems to display for this patient.   History reviewed. No pertinent surgical history.     Home Medications    Prior to Admission medications   Medication Sig Start Date End Date Taking? Authorizing Provider  doxycycline (VIBRAMYCIN) 100 MG capsule Take 1 capsule (100 mg total) by mouth 2 (two) times daily. 08/25/21  Yes Peyton Spengler, Rolly Salter E, FNP  erythromycin ophthalmic ointment Place a 1/2 inch ribbon of ointment into the lower eyelid 4 times daily for 7 days. 08/25/21  Yes Dreya Buhrman, Rolly Salter E, FNP  ibuprofen (ADVIL,MOTRIN) 200 MG tablet Take 800 mg by mouth every 6 (six) hours as needed for moderate pain.    [provider]  oxyCODONE-acetaminophen (PERCOCET/ROXICET) 5-325 MG  tablet Take 1 tablet by mouth every 8 (eight) hours as needed for severe pain. 09/05/15   Ward, Chase Picket, PA-C    Family History History reviewed. No pertinent family history.  Social History Social History   Tobacco Use   Smoking status: Some Days    Types: Cigarettes   Smokeless tobacco: Former  Building services engineer Use: Every day   Substances: Nicotine, Flavoring  Substance Use Topics   Alcohol use: Yes   Drug use: No     Allergies   Patient has no known allergies.   Review of Systems Review of Systems Per HPI  Physical Exam Triage Vital Signs ED Triage Vitals  Enc Vitals Group     BP 08/25/21 1812 127/80     Pulse Rate 08/25/21 1812 (!) 111     Resp 08/25/21 1812 18     Temp 08/25/21 1812 98.7 F (37.1 C)     Temp Source 08/25/21 1812 Oral     SpO2 08/25/21 1812 98 %     Weight --      Height --      Head Circumference --      Peak Flow --      Pain Score 08/25/21 1813 0     Pain Loc --      Pain Edu? --      Excl. in GC? --    No data found.  Updated Vital Signs BP 127/80 (BP Location: Left Arm)   Pulse Marland Kitchen)  111   Temp 98.7 F (37.1 C) (Oral)   Resp 18   SpO2 98%   Visual Acuity Right Eye Distance:   Left Eye Distance:   Bilateral Distance:    Right Eye Near:   Left Eye Near:    Bilateral Near:     Physical Exam Constitutional:      General: He is not in acute distress.    Appearance: Normal appearance. He is not toxic-appearing or diaphoretic.  HENT:     Head: Normocephalic and atraumatic.  Eyes:     Extraocular Movements: Extraocular movements intact.     Conjunctiva/sclera: Conjunctivae normal.  Pulmonary:     Effort: Pulmonary effort is normal.  Genitourinary:    Comments: Deferred with shared decision-making.  Self swab performed. Skin:    Comments: Multiple different inflammatory lesions present throughout patient's skin.  He has multiple to face with one overlying left eyebrow that extends slightly into left eyelid with  surrounding erythema.  There is purulent drainage coming from area overlying eyebrow.  There is a lesion to the right face that is scabbed over.  Patient has similar lesions to right forearm and wrist with purulent drainage.  Similar lesions present to right chest and mid abdomen with induration surrounding.  Purulent drainage coming from these as well.  No obvious fluctuance noted to any of the lesions.  Neurological:     General: No focal deficit present.     Mental Status: He is alert and oriented to person, place, and time. Mental status is at baseline.  Psychiatric:        Mood and Affect: Mood normal.        Behavior: Behavior normal.        Thought Content: Thought content normal.        Judgment: Judgment normal.      UC Treatments / Results  Labs (all labs ordered are listed, but only abnormal results are displayed) Labs Reviewed  CYTOLOGY, (ORAL, ANAL, URETHRAL) ANCILLARY ONLY    EKG   Radiology No results found.  Procedures Procedures (including critical care time)  Medications Ordered in UC Medications  metroNIDAZOLE (FLAGYL) tablet 2,000 mg (2,000 mg Oral Given 08/25/21 1851)    Initial Impression / Assessment and Plan / UC Course  I have reviewed the triage vital signs and the nursing notes.  Pertinent labs & imaging results that were available during my care of the patient were reviewed by me and considered in my medical decision making (see chart for details).     Patient has multiple different scabbed over and abscess-like lesions present throughout skin.  Due to some having purulent drainage and obvious infection, will treat with doxycycline antibiotic.  None of them need I&D at this time as they are all indurated and most of them are flat.  Patient advised to follow-up with dermatology given that he is already established with them prior for further evaluation and management given flareup.  There is concern that these lesions could be related to  methamphetamine use, although patient reports that these lesions started prior to illicit drug use.  Will also prescribe erythromycin given minimal purulent drainage coming from left conjunctivae.  Patient advised to follow-up with eye doctor as soon as possible as well. Visual acuity appears normal.  No concern for orbital or preseptal cellulitis.  Will treat prophylactically for possible exposure to trichomoniasis with 2 g of Flagyl in urgent care today.  Cytology swab pending.  Will follow-up with results for any  further treatment.  Patient advised to refrain from sexual activity for at least 7 days following treatment.  Discussed return precautions.  Patient verbalized understanding and was agreeable with plan. Final Clinical Impressions(s) / UC Diagnoses   Final diagnoses:  Abscess of multiple sites  STD exposure  Screening examination for venereal disease     Discharge Instructions      You have been prescribed antibiotics for all chief complaints today.  Please follow-up with dermatologist for further evaluation and management.  Your STD test is pending.  We will call if it is abnormal.    ED Prescriptions     Medication Sig Dispense Auth. Provider   erythromycin ophthalmic ointment Place a 1/2 inch ribbon of ointment into the lower eyelid 4 times daily for 7 days. 3.5 g Ervin Knack E, [place]   doxycycline (VIBRAMYCIN) 100 MG capsule Take 1 capsule (100 mg total) by mouth 2 (two) times daily. 20 capsule Gustavus Bryant, [place]      PDMP not reviewed this encounter.   Gustavus Bryant, [place] 08/25/21 1920    Gustavus Bryant, [place] 08/25/21 1920

## 2021-08-25 NOTE — Discharge Instructions (Signed)
You have been prescribed antibiotics for all chief complaints today.  Please follow-up with dermatologist for further evaluation and management.  Your STD test is pending.  We will call if it is abnormal.

## 2021-08-28 LAB — CYTOLOGY, (ORAL, ANAL, URETHRAL) ANCILLARY ONLY
Chlamydia: NEGATIVE
Comment: NEGATIVE
Comment: NEGATIVE
Comment: NORMAL
Neisseria Gonorrhea: NEGATIVE
Trichomonas: POSITIVE — AB

## 2021-09-01 ENCOUNTER — Telehealth (HOSPITAL_COMMUNITY): Payer: Self-pay | Admitting: Emergency Medicine

## 2022-05-12 IMAGING — US US ABDOMEN LIMITED
1 series · 13 of 25 positions shown · non-contrast
Comparison: CT L-spine 09/05/2015

CLINICAL DATA: Right upper quadrant pain

EXAM:
ULTRASOUND ABDOMEN LIMITED RIGHT UPPER QUADRANT

[Series 1: us abdomen limited · 13 of 34 slices shown]
[im 1/34]
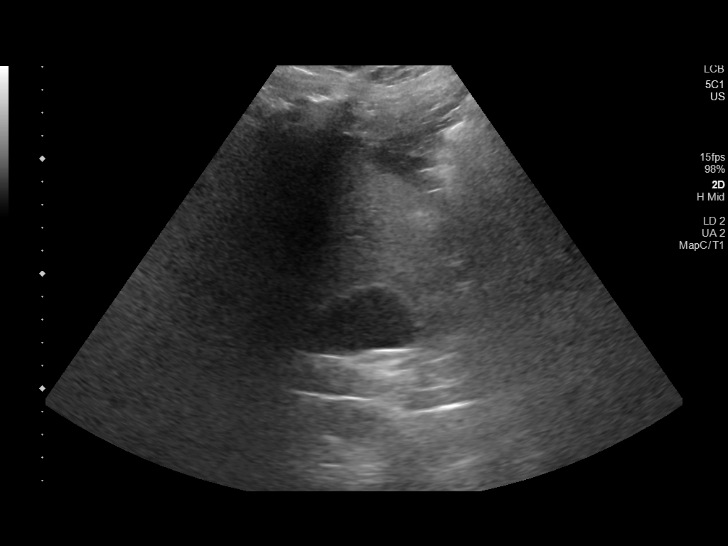
[im 3/34]
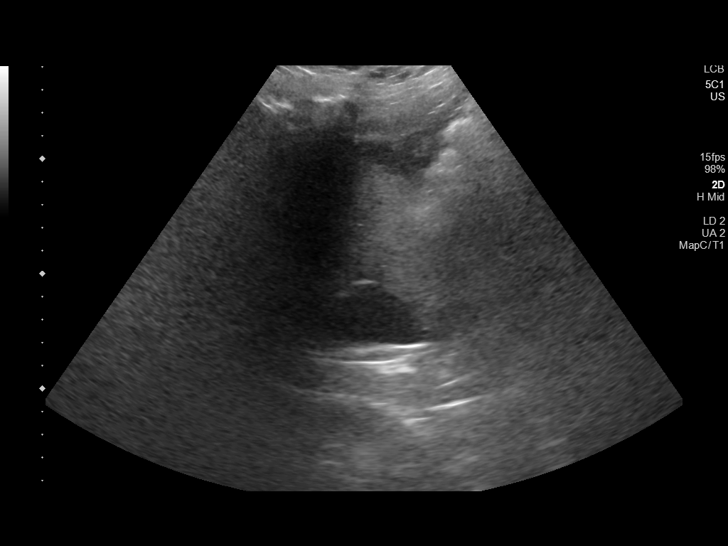
[im 6/34]
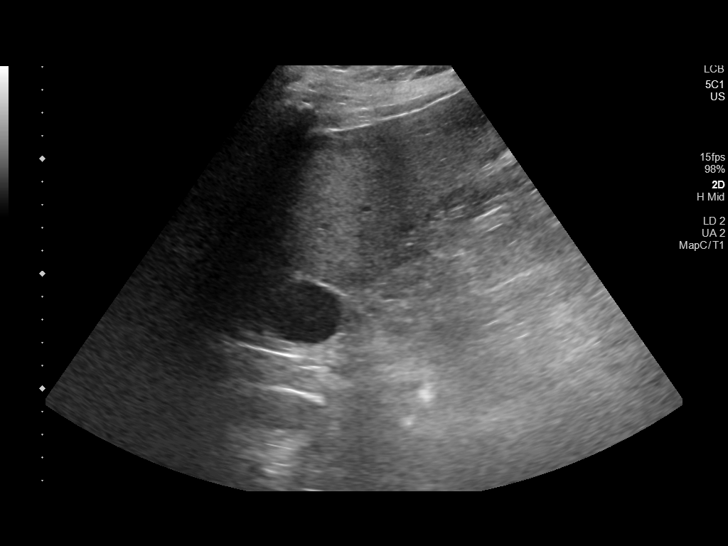
[im 9/34]
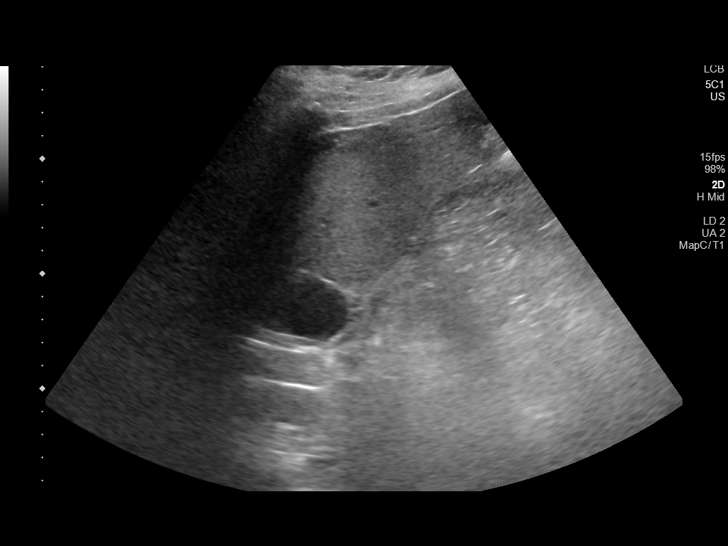
[im 12/34]
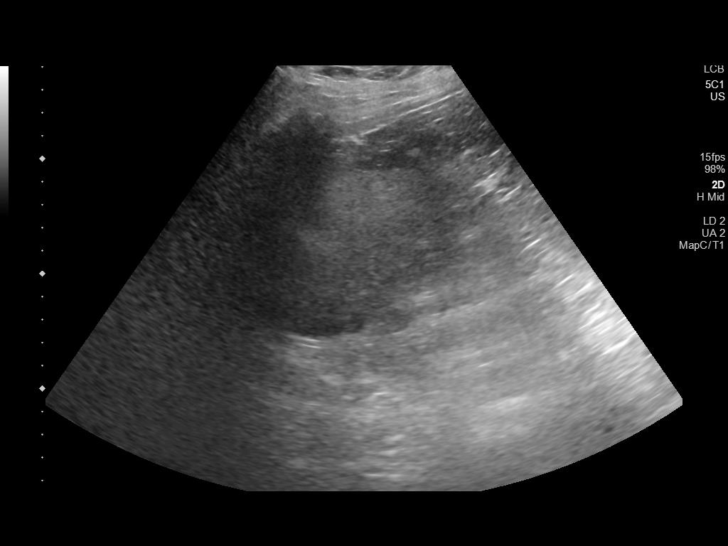
[im 14/34]
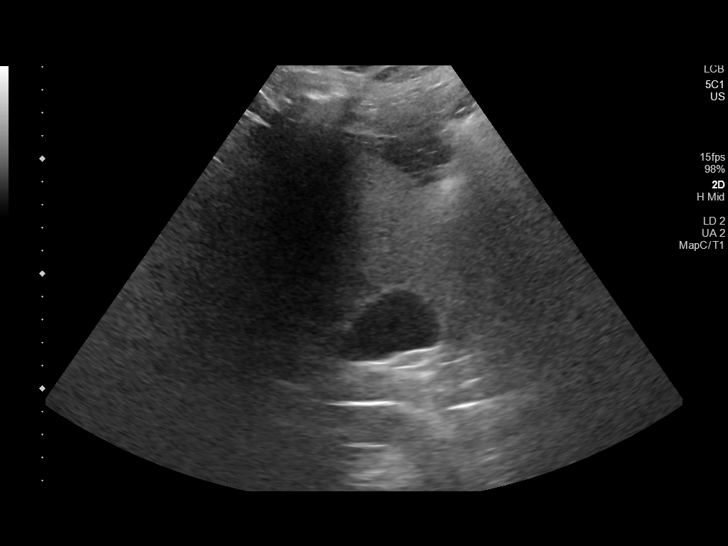
[im 17/34]
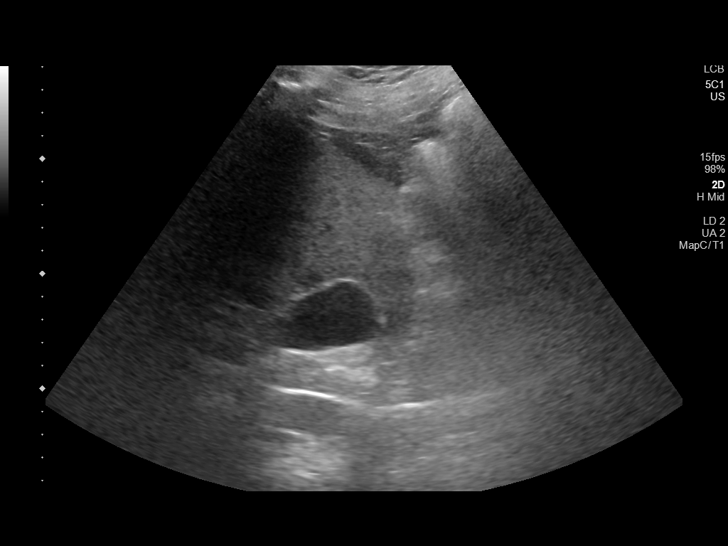
[im 20/34]
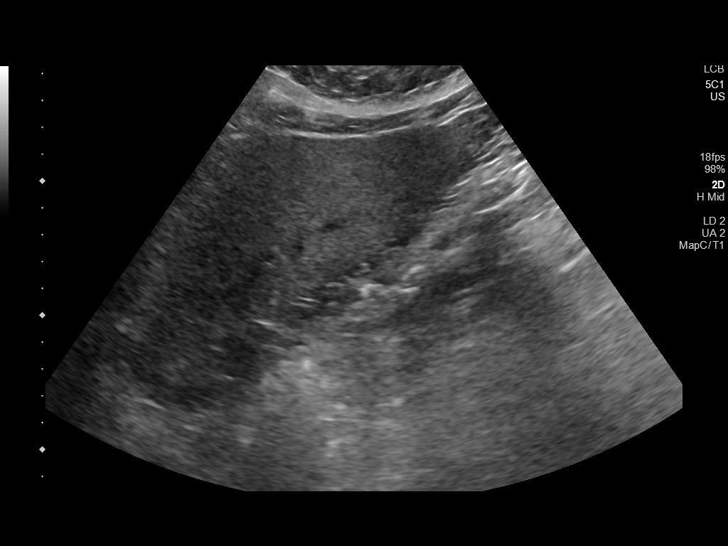
[im 23/34]
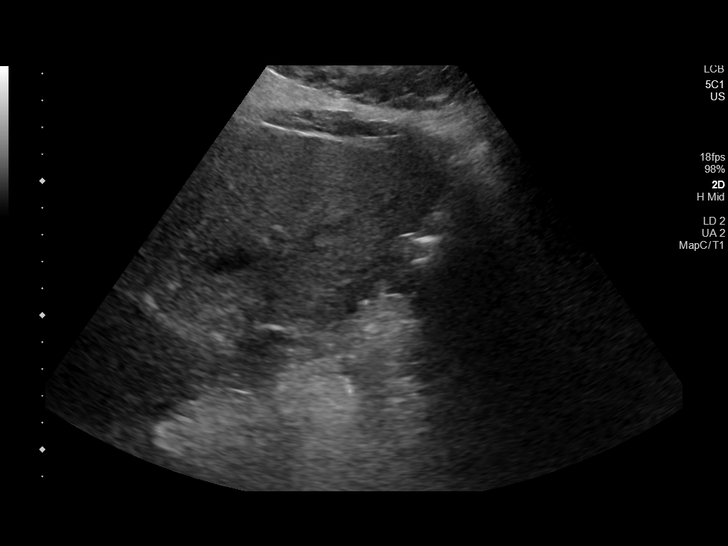
[im 25/34]
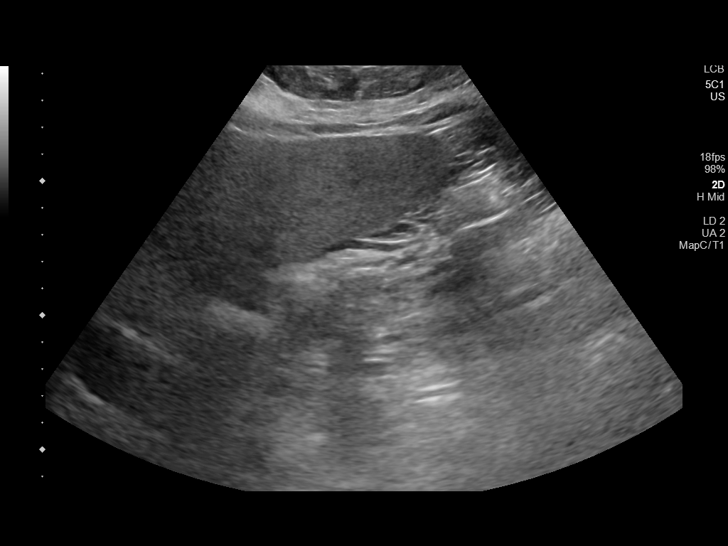
[im 28/34]
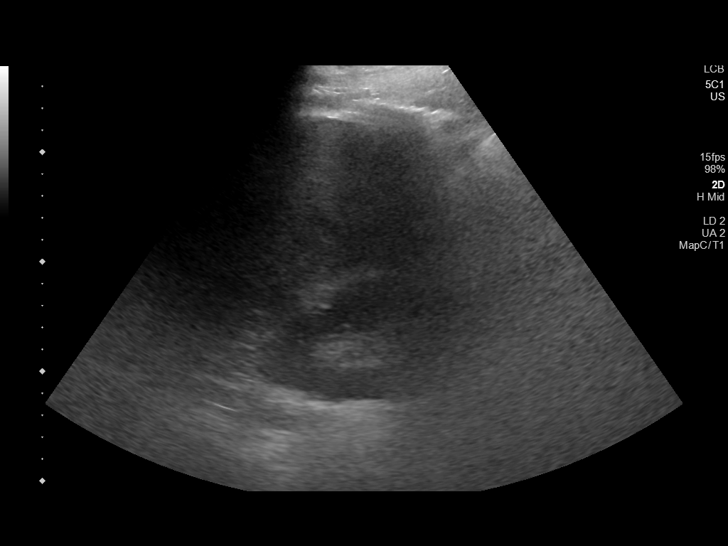
[im 31/34]
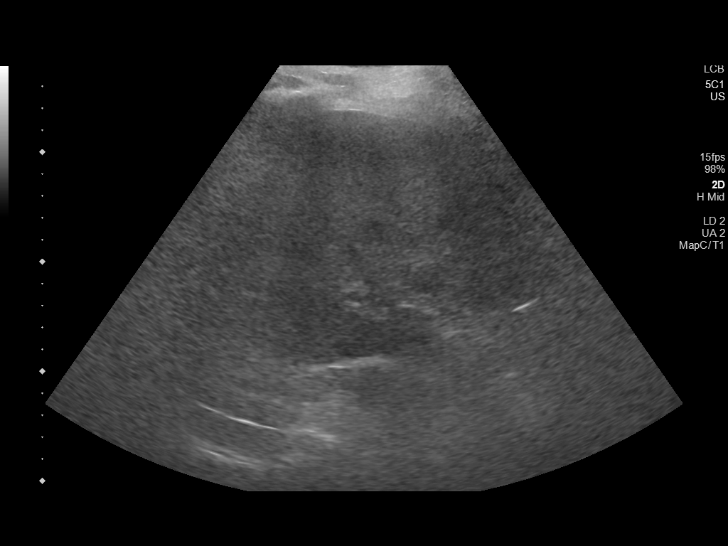
[im 34/34]
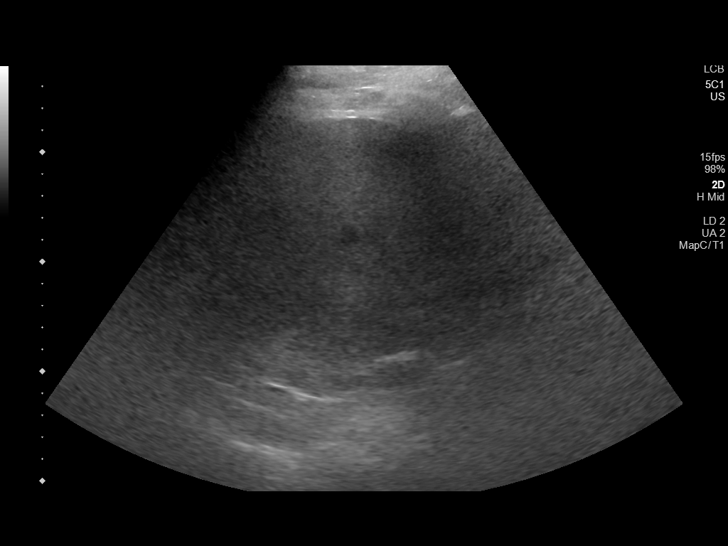

[13 of 25 positions shown; findings below may reference images not displayed]

FINDINGS: Gallbladder:

Some echogenic biliary sludge noted within the gallbladder lumen. No
visible shadowing gallstones. Wall thickness at 2.5 mm is within
normal limits. No pericholecystic fluid. Sonographic Murphy sign is
reportedly negative.

Common bile duct:

Diameter: 9.5 mm, dilated.  No visible intraductal gallstones.

Liver:

Diffusely increased hepatic echogenicity with loss of definition of
the portal triads and diminished posterior through transmission
compatible with hepatic steatosis. No focal liver lesion. Portal
vein is patent on color Doppler imaging with normal direction of
blood flow towards the liver.

Other: Technically challenging exam due to patient's body habitus as
well as diminished through transmission secondary to hepatic
steatosis.
IMPRESSION: Biliary sludge within the gallbladder without visible cholelithiasis
or evidence of acute cholecystitis.

Biliary ductal dilatation, nonspecific. Choledocholithiasis is not
fully excluded. Recommend correlation with lab values and exam
findings and if there is clinical concern for biliary obstruction or
choledocholithiasis, MRCP could be obtained.

Hepatic steatosis.

Technically challenging exam due to patient body habitus and
diminished through transmission secondary to hepatic steatosis.

## 2024-01-01 ENCOUNTER — Ambulatory Visit
Admission: RE | Admit: 2024-01-01 | Discharge: 2024-01-01 | Disposition: A | Discharge status: Home / Self Care | Payer: Self-pay | Attending: Physician Assistant | Admitting: Physician Assistant

## 2024-01-01 ENCOUNTER — Other Ambulatory Visit: Payer: Self-pay

## 2024-01-01 VITALS — BP 120/73 | HR 87 | Temp 98.3°F | Resp 18 | Ht 72.0 in | Wt 264.0 lb

## 2024-01-01 DIAGNOSIS — H60392 Other infective otitis externa, left ear: Secondary | ICD-10-CM

## 2024-01-01 MED ORDER — OFLOXACIN 0.3 % OT SOLN: 5.0000 [drp] | Freq: Two times a day (BID) | OTIC | 0 refills | Status: AC

## 2024-01-01 NOTE — ED Triage Notes (Signed)
 Pt presents with a chief complaint of left ear pain x 4-5 days. Currently rates overall ear pain an 8/10. Denies taking OTC medications PTA for symptoms reported. Also has noticed an increase in fatigue. Noticed this about one week ago. No fevers. Denies sick contacts.

## 2024-01-01 NOTE — Discharge Instructions (Addendum)
 You were seen today for concerns of right ear pain.  Your physical exam is consistent with an infection in your ear canal also known as otitis externa. And sent in a medication called ofloxacin eardrops.  This is an antibiotic eardrop for you to apply to the left ear twice per day for 7 days. Please try to avoid getting the ear canal read or submerging your head in water as this can cause further infection and contamination in the area If you feel like your symptoms are not improving or seem to be worsening you can return here or follow-up with your PCP for further evaluation If you develop any of the following please return to urgent care or the emergency room as these could be signs of worsening infection: Severe pain of the ear, pain in the surrounding areas such as behind the ear or the jaw, fevers, displacement of the ear due to swelling, severe headaches, copious amounts of drainage and blood from the ear

## 2024-01-01 NOTE — ED Provider Notes (Signed)
 [facility]    CSN: [id number] Arrival date & time: 01/01/24  1336      History   Chief Complaint Chief Complaint  Patient presents with   Otalgia   Fatigue    HPI Chad Greene is a 53 y.o. male.   HPI  Patient presents today with concerns for left ear pain has been ongoing for about 4 days.  He does report drainage from the ear.  He states that he typically cleans his ears using Q-tips but denies recent trauma or injury.  He denies fever, chills, nasal congestion, runny nose, sore throat.  He denies headaches, dizziness, lightheadedness.  Past Medical History:  Diagnosis Date   Lumbar vertebral fracture (HCC)     There are no active problems to display for this patient.   History reviewed. No pertinent surgical history.     Home Medications    Prior to Admission medications   Medication Sig Start Date End Date Taking? Authorizing Provider  ofloxacin (FLOXIN) 0.3 % OTIC solution Place 5 drops into the left ear 2 (two) times daily for 7 days. 01/01/24 01/08/24 Yes Jamar Weatherall E, PA-C  doxycycline  (VIBRAMYCIN ) 100 MG capsule Take 1 capsule (100 mg total) by mouth 2 (two) times daily. 08/25/21   Hazen Darryle BRAVO, FNP  erythromycin  ophthalmic ointment Place a 1/2 inch ribbon of ointment into the lower eyelid 4 times daily for 7 days. 08/25/21   Hazen Darryle BRAVO, FNP  ibuprofen (ADVIL,MOTRIN) 200 MG tablet Take 800 mg by mouth every 6 (six) hours as needed for moderate pain.    [provider]  oxyCODONE -acetaminophen  (PERCOCET/ROXICET) 5-325 MG tablet Take 1 tablet by mouth every 8 (eight) hours as needed for severe pain. 09/05/15   Ward, Ami Copes, PA-C    Family History History reviewed. No pertinent family history.  Social History Social History   Tobacco Use   Smoking status: Some Days    Types: Cigarettes   Smokeless tobacco: Former  Building services engineer status: Every Day   Substances: Nicotine, Flavoring  Substance Use Topics    Alcohol use: Yes   Drug use: No     Allergies   Patient has no known allergies.   Review of Systems Review of Systems  Constitutional:  Positive for fatigue. Negative for chills and fever.  HENT:  Positive for ear discharge and ear pain. Negative for congestion, hearing loss, rhinorrhea and sore throat.   Neurological:  Negative for dizziness, light-headedness and headaches.     Physical Exam Triage Vital Signs ED Triage Vitals  Encounter Vitals Group     BP 01/01/24 1359 120/73     Girls Systolic BP Percentile --      Girls Diastolic BP Percentile --      Boys Systolic BP Percentile --      Boys Diastolic BP Percentile --      Pulse Rate 01/01/24 1359 87     Resp 01/01/24 1359 18     Temp 01/01/24 1359 98.3 F (36.8 C)     Temp Source 01/01/24 1359 Oral     SpO2 01/01/24 1359 98 %     Weight 01/01/24 1359 264 lb (119.7 kg)     Height 01/01/24 1359 6' (1.829 m)     Head Circumference --      Peak Flow --      Pain Score 01/01/24 1415 8     Pain Loc --      Pain Education --  Exclude from Growth Chart --    No data found.  Updated Vital Signs BP 120/73 (BP Location: Right Arm)   Pulse 87   Temp 98.3 F (36.8 C) (Oral)   Resp 18   Ht 6' (1.829 m)   Wt 264 lb (119.7 kg)   SpO2 98%   BMI 35.80 kg/m   Visual Acuity Right Eye Distance:   Left Eye Distance:   Bilateral Distance:    Right Eye Near:   Left Eye Near:    Bilateral Near:     Physical Exam Vitals reviewed.  Constitutional:      General: He is sleeping. He is not in acute distress.    Appearance: Normal appearance. He is well-developed and well-groomed. He is not ill-appearing, toxic-appearing or diaphoretic.  HENT:     Head: Normocephalic and atraumatic.     Right Ear: Hearing, tympanic membrane and ear canal normal.     Left Ear: Hearing and external ear normal. Drainage and swelling present. No hemotympanum. Tympanic membrane is scarred, erythematous and retracted. Tympanic membrane is  not perforated.     Mouth/Throat:     Lips: Pink.     Mouth: Mucous membranes are moist.     Pharynx: Oropharynx is clear. Uvula midline. No pharyngeal swelling, oropharyngeal exudate, posterior oropharyngeal erythema, uvula swelling or postnasal drip.     Tonsils: No tonsillar exudate or tonsillar abscesses.  Eyes:     General: Lids are normal. Gaze aligned appropriately.     Extraocular Movements: Extraocular movements intact.  Cardiovascular:     Rate and Rhythm: Normal rate and regular rhythm.     Heart sounds: Normal heart sounds.  Pulmonary:     Effort: Pulmonary effort is normal.     Breath sounds: Normal breath sounds. No decreased air movement. No decreased breath sounds, wheezing, rhonchi or rales.  Musculoskeletal:     Cervical back: Normal range of motion and neck supple.  Lymphadenopathy:     Head:     Right side of head: No submental, submandibular or preauricular adenopathy.     Left side of head: No submental, submandibular or preauricular adenopathy.     Cervical:     Right cervical: No superficial cervical adenopathy.    Left cervical: No superficial cervical adenopathy.     Upper Body:     Right upper body: No supraclavicular adenopathy.     Left upper body: No supraclavicular adenopathy.  Skin:    General: Skin is warm and dry.  Neurological:     General: No focal deficit present.     Mental Status: He is oriented to person, place, and time and easily aroused. He is lethargic.  Psychiatric:        Mood and Affect: Mood normal.        Behavior: Behavior normal. Behavior is cooperative.        Thought Content: Thought content normal.        Judgment: Judgment normal.      UC Treatments / Results  Labs (all labs ordered are listed, but only abnormal results are displayed) Labs Reviewed - No data to display  EKG   Radiology No results found.  Procedures Procedures (including critical care time)  Medications Ordered in UC Medications - No data to  display  Initial Impression / Assessment and Plan / UC Course  I have reviewed the triage vital signs and the nursing notes.  Pertinent labs & imaging results that were available during my care of  the patient were reviewed by me and considered in my medical decision making (see chart for details).      Final Clinical Impressions(s) / UC Diagnoses   Final diagnoses:  Infective otitis externa of left ear   Patient presents today with concerns for left-sided ear pain and drainage.  That has been ongoing for about 4 days.  He reports that he typically cleans his ears with Q-tips but denies recent trauma or injury.  Physical exam is notable for ear canal swelling as well as purulent appearing drainage but tympanic membrane appears intact.  Will start ofloxacin otic drops to assist with suspected otitis externa.  Reviewed home care measures as well as ED and return precautions.  These were also provided in AVS.  Follow-up as needed for progressing or persistent symptoms   Discharge Instructions      You were seen today for concerns of right ear pain.  Your physical exam is consistent with an infection in your ear canal also known as otitis externa. And sent in a medication called ofloxacin eardrops.  This is an antibiotic eardrop for you to apply to the left ear twice per day for 7 days. Please try to avoid getting the ear canal read or submerging your head in water as this can cause further infection and contamination in the area If you feel like your symptoms are not improving or seem to be worsening you can return here or follow-up with your PCP for further evaluation If you develop any of the following please return to urgent care or the emergency room as these could be signs of worsening infection: Severe pain of the ear, pain in the surrounding areas such as behind the ear or the jaw, fevers, displacement of the ear due to swelling, severe headaches, copious amounts of drainage and blood  from the ear      ED Prescriptions     Medication Sig Dispense Auth. Provider   ofloxacin (FLOXIN) 0.3 % OTIC solution Place 5 drops into the left ear 2 (two) times daily for 7 days. 5 mL Kaliyan Osbourn E, PA-C      PDMP not reviewed this encounter.   Marylene Rocky BRAVO, PA-C 01/01/24 2021

## 2024-02-07 ENCOUNTER — Emergency Department (HOSPITAL_COMMUNITY): Payer: Self-pay

## 2024-02-07 ENCOUNTER — Other Ambulatory Visit: Payer: Self-pay

## 2024-02-07 ENCOUNTER — Encounter (HOSPITAL_COMMUNITY): Payer: Self-pay | Admitting: Psychiatry

## 2024-02-07 ENCOUNTER — Emergency Department (HOSPITAL_COMMUNITY)
Admission: EM | Admit: 2024-02-07 | Discharge: 2024-02-07 | Disposition: A | Discharge status: Other Acute Inpatient Hospital | Payer: Self-pay | Attending: Emergency Medicine | Admitting: Emergency Medicine

## 2024-02-07 ENCOUNTER — Inpatient Hospital Stay (HOSPITAL_COMMUNITY): Admission: AD | Admit: 2024-02-07 | Discharge: 2024-02-14 | DRG: 897 | Disposition: A | Source: Intra-hospital

## 2024-02-07 DIAGNOSIS — F411 Generalized anxiety disorder: Secondary | ICD-10-CM | POA: Diagnosis not present

## 2024-02-07 DIAGNOSIS — R443 Hallucinations, unspecified: Secondary | ICD-10-CM

## 2024-02-07 DIAGNOSIS — F15259 Other stimulant dependence with stimulant-induced psychotic disorder, unspecified: Principal | ICD-10-CM | POA: Diagnosis present

## 2024-02-07 DIAGNOSIS — Z720 Tobacco use: Secondary | ICD-10-CM | POA: Insufficient documentation

## 2024-02-07 DIAGNOSIS — F15959 Other stimulant use, unspecified with stimulant-induced psychotic disorder, unspecified: Principal | ICD-10-CM | POA: Diagnosis present

## 2024-02-07 DIAGNOSIS — Y900 Blood alcohol level of less than 20 mg/100 ml: Secondary | ICD-10-CM | POA: Insufficient documentation

## 2024-02-07 DIAGNOSIS — R519 Headache, unspecified: Secondary | ICD-10-CM | POA: Insufficient documentation

## 2024-02-07 DIAGNOSIS — R059 Cough, unspecified: Secondary | ICD-10-CM | POA: Insufficient documentation

## 2024-02-07 DIAGNOSIS — R911 Solitary pulmonary nodule: Secondary | ICD-10-CM | POA: Insufficient documentation

## 2024-02-07 DIAGNOSIS — F22 Delusional disorders: Secondary | ICD-10-CM | POA: Insufficient documentation

## 2024-02-07 DIAGNOSIS — F1590 Other stimulant use, unspecified, uncomplicated: Secondary | ICD-10-CM

## 2024-02-07 DIAGNOSIS — F1210 Cannabis abuse, uncomplicated: Secondary | ICD-10-CM | POA: Insufficient documentation

## 2024-02-07 DIAGNOSIS — R0981 Nasal congestion: Secondary | ICD-10-CM | POA: Insufficient documentation

## 2024-02-07 LAB — CBC
HCT: 42.3 % (ref 39.0–52.0)
Hemoglobin: 13.6 g/dL (ref 13.0–17.0)
MCH: 29.3 pg (ref 26.0–34.0)
MCHC: 32.2 g/dL (ref 30.0–36.0)
MCV: 91.2 fL (ref 80.0–100.0)
Platelets: 378 K/uL (ref 150–400)
RBC: 4.64 MIL/uL (ref 4.22–5.81)
RDW: 12.9 % (ref 11.5–15.5)
WBC: 12.1 K/uL — ABNORMAL HIGH (ref 4.0–10.5)
nRBC: 0 % (ref 0.0–0.2)

## 2024-02-07 LAB — COMPREHENSIVE METABOLIC PANEL WITH GFR
ALT: 15 U/L (ref 0–44)
AST: 15 U/L (ref 15–41)
Albumin: 3.5 g/dL (ref 3.5–5.0)
Alkaline Phosphatase: 66 U/L (ref 38–126)
Anion gap: 11 (ref 5–15)
BUN: 8 mg/dL (ref 6–20)
CO2: 27 mmol/L (ref 22–32)
Calcium: 9.1 mg/dL (ref 8.9–10.3)
Chloride: 103 mmol/L (ref 98–111)
Creatinine, Ser: 0.77 mg/dL (ref 0.61–1.24)
GFR, Estimated: >60 mL/min (ref >60)
Glucose, Bld: 110 mg/dL — ABNORMAL HIGH (ref 70–99)
Potassium: 3.6 mmol/L (ref 3.5–5.1)
Sodium: 141 mmol/L (ref 135–145)
Total Bilirubin: 0.5 mg/dL (ref 0.0–1.2)
Total Protein: 7.6 g/dL (ref 6.5–8.1)

## 2024-02-07 LAB — RAPID URINE DRUG SCREEN, HOSP PERFORMED
Amphetamines: POSITIVE — AB
Barbiturates: NOT DETECTED
Benzodiazepines: NOT DETECTED
Cocaine: NOT DETECTED
Opiates: NOT DETECTED
Tetrahydrocannabinol: POSITIVE — AB

## 2024-02-07 LAB — ETHANOL: Alcohol, Ethyl (B): <15 mg/dL (ref <15)

## 2024-02-07 MED ORDER — HALOPERIDOL LACTATE 5 MG/ML IJ SOLN: 10.0000 mg | Freq: Three times a day (TID) | INTRAMUSCULAR | Status: DC | PRN

## 2024-02-07 MED ORDER — MAGNESIUM HYDROXIDE 400 MG/5ML PO SUSP: 30.0000 mL | Freq: Every day | ORAL | Status: DC | PRN

## 2024-02-07 MED ORDER — NICOTINE 21 MG/24HR TD PT24: 21.0000 mg | MEDICATED_PATCH | Freq: Every day | TRANSDERMAL | Status: DC

## 2024-02-07 MED ORDER — HALOPERIDOL 5 MG PO TABS: 5.0000 mg | ORAL_TABLET | Freq: Three times a day (TID) | ORAL | Status: DC | PRN

## 2024-02-07 MED ORDER — TRAZODONE HCL 50 MG PO TABS
50.0000 mg | ORAL_TABLET | Freq: Every evening | ORAL | Status: DC | PRN
  Filled 2024-02-07: qty 1

## 2024-02-07 MED ORDER — HALOPERIDOL LACTATE 5 MG/ML IJ SOLN: 5.0000 mg | Freq: Three times a day (TID) | INTRAMUSCULAR | Status: DC | PRN

## 2024-02-07 MED ORDER — OLANZAPINE 5 MG PO TABS
5.0000 mg | ORAL_TABLET | Freq: Every day | ORAL | Status: DC
  Administered 2024-02-08 – 2024-02-09 (×2): 5 mg via ORAL
  Filled 2024-02-07 (×3): qty 1

## 2024-02-07 MED ORDER — LORAZEPAM 2 MG/ML IJ SOLN: 2.0000 mg | Freq: Three times a day (TID) | INTRAMUSCULAR | Status: DC | PRN

## 2024-02-07 MED ORDER — DIPHENHYDRAMINE HCL 50 MG/ML IJ SOLN: 50.0000 mg | Freq: Three times a day (TID) | INTRAMUSCULAR | Status: DC | PRN

## 2024-02-07 MED ORDER — ACETAMINOPHEN 325 MG PO TABS: 650.0000 mg | ORAL_TABLET | Freq: Four times a day (QID) | ORAL | Status: DC | PRN

## 2024-02-07 MED ORDER — HYDROXYZINE HCL 25 MG PO TABS
25.0000 mg | ORAL_TABLET | Freq: Three times a day (TID) | ORAL | Status: AC | PRN
  Administered 2024-02-08: 25 mg via ORAL
  Filled 2024-02-07 (×2): qty 1

## 2024-02-07 MED ORDER — OLANZAPINE 5 MG PO TABS
5.0000 mg | ORAL_TABLET | Freq: Every day | ORAL | Status: DC
  Administered 2024-02-07: 5 mg via ORAL
  Filled 2024-02-07: qty 1

## 2024-02-07 MED ORDER — ALUM & MAG HYDROXIDE-SIMETH 200-200-20 MG/5ML PO SUSP
30.0000 mL | ORAL | Status: DC | PRN
  Administered 2024-02-12: 30 mL via ORAL
  Filled 2024-02-07: qty 30

## 2024-02-07 MED ORDER — DIPHENHYDRAMINE HCL 25 MG PO CAPS: 50.0000 mg | ORAL_CAPSULE | Freq: Three times a day (TID) | ORAL | Status: DC | PRN

## 2024-02-07 NOTE — ED Notes (Signed)
 Report given to Revonda, CHARITY FUNDRAISER at [facility].

## 2024-02-07 NOTE — ED Notes (Signed)
 Patient transported to CT

## 2024-02-07 NOTE — ED Notes (Signed)
 PT belongings behind desk at purple RN station, 2 PT belonging bags and green suitcase

## 2024-02-07 NOTE — Group Note (Signed)
 Date:  02/07/2024 Time:  10:35 PM  Group Topic/Focus:  Wrap-Up Group:   The focus of this group is to help patients review their daily goal of treatment and discuss progress on daily workbooks.    Participation Level:  Did Not Attend  Additional Comments:    Kristen VEAR Gibbon 02/07/2024, 10:35 PM

## 2024-02-07 NOTE — ED Triage Notes (Signed)
 Patient reports smoking meth Wednesday night and since then having AVH. Prior to that, hallucinations going on x 2 months. Denies SI and states only homicidal if someone is trying to hurt him.

## 2024-02-07 NOTE — ED Notes (Signed)
 Consent form uploaded to document [facility] [employer]

## 2024-02-07 NOTE — ED Provider Notes (Signed)
 Chad Greene Provider Note   CSN: [id number] Arrival date & time: 02/07/24  1046     Patient presents with: No chief complaint on file.   Chad Greene is a 53 y.o. male.   Patient is a 53 year old male with history of methamphetamine use presenting to the emergency department with hallucinations.  Patient states that he has been having auditory hallucinations and paranoia that people are following him for about the last month.  He states that the voices are telling him to hurt other people that are coming after him.  He states he does not have any specific plan to hurt anyone.  Denies any SI or auditory hallucinations telling him to hurt himself.  He states that he has had occasional headaches.  He states that recently he has had a productive cough and congestion.  Denies any fever.  Denies any recent nausea, vomiting or diarrhea.  He denies any other drug or alcohol use.  He states that he did go to an outside ED yesterday but did not like it there and so left without full evaluation.  States that he is interested in seeing psychiatry as well as going to rehab for his meth use.  He states that he does use meth daily.  The history is provided by the patient.       Prior to Admission medications   Medication Sig Start Date End Date Taking? Authorizing Provider  doxycycline  (VIBRAMYCIN ) 100 MG capsule Take 1 capsule (100 mg total) by mouth 2 (two) times daily. 08/25/21   Hazen Darryle BRAVO, FNP  erythromycin  ophthalmic ointment Place a 1/2 inch ribbon of ointment into the lower eyelid 4 times daily for 7 days. 08/25/21   Hazen Darryle BRAVO, FNP  ibuprofen (ADVIL,MOTRIN) 200 MG tablet Take 800 mg by mouth every 6 (six) hours as needed for moderate pain.    [provider]  oxyCODONE -acetaminophen  (PERCOCET/ROXICET) 5-325 MG tablet Take 1 tablet by mouth every 8 (eight) hours as needed for severe pain. 09/05/15   Ward, Jaime Pilcher, PA-C     Allergies: Patient has no known allergies.    Review of Systems  Updated Vital Signs BP 138/86 (BP Location: Left Arm)   Pulse 88   Temp 98.1 F (36.7 C)   Resp 18   SpO2 97%   Physical Exam Vitals and nursing note reviewed.  Constitutional:      General: He is not in acute distress.    Appearance: Normal appearance.  HENT:     Head: Normocephalic and atraumatic.     Nose: Nose normal.     Mouth/Throat:     Mouth: Mucous membranes are moist.  Eyes:     Extraocular Movements: Extraocular movements intact.     Conjunctiva/sclera: Conjunctivae normal.     Pupils: Pupils are equal, round, and reactive to light.  Cardiovascular:     Rate and Rhythm: Normal rate.  Pulmonary:     Effort: Pulmonary effort is normal.  Abdominal:     General: Abdomen is flat.  Musculoskeletal:        General: Normal range of motion.     Cervical back: Normal range of motion.  Skin:    General: Skin is warm and dry.  Neurological:     General: No focal deficit present.     Mental Status: He is alert and oriented to person, place, and time.     Sensory: No sensory deficit.     Motor:  No weakness.  Psychiatric:        Mood and Affect: Mood normal.        Behavior: Behavior normal.     (all labs ordered are listed, but only abnormal results are displayed) Labs Reviewed  COMPREHENSIVE METABOLIC PANEL WITH GFR - Abnormal; Notable for the following components:      Result Value   Glucose, Bld 110 (*)    All other components within normal limits  CBC - Abnormal; Notable for the following components:   WBC 12.1 (*)    All other components within normal limits  ETHANOL  RAPID URINE DRUG SCREEN, HOSP PERFORMED    EKG: None  Radiology: CT Head Wo Contrast Result Date: 02/07/2024 EXAM: CT HEAD WITHOUT 02/07/2024 12:20:48 PM TECHNIQUE: CT of the head was performed without the administration of intravenous contrast. Automated exposure control, iterative reconstruction, and/or weight  based adjustment of the mA/kV was utilized to reduce the radiation dose to as low as reasonably achievable. COMPARISON: None available. CLINICAL HISTORY: Mental status change, unknown cause. FINDINGS: BRAIN AND VENTRICLES: No acute intracranial hemorrhage. No mass effect or midline shift. No extra-axial fluid collection. No evidence of acute infarct. No hydrocephalus. ORBITS: No acute abnormality. SINUSES AND MASTOIDS: Near complete opacification of maxillary sinuses. Mucosal thickening throughout ethmoid air cells. SOFT TISSUES AND SKULL: No acute skull fracture. No acute soft tissue abnormality. IMPRESSION: 1. No acute intracranial abnormality. 2. Near complete opacification of maxillary sinuses and mucosal thickening throughout ethmoid air cells. Electronically signed by: Evalene Coho MD 02/07/2024 12:49 PM EST RP Workstation: [id number]   DG Chest 2 View Result Date: 02/07/2024 CLINICAL DATA:  Cough.  Smoking meth 2 days ago. EXAM: CHEST - 2 VIEW COMPARISON:  11/10/2019. FINDINGS: Cardiac silhouette is normal in size and configuration. No mediastinal or hilar masses. No evidence of adenopathy. 8-9 mm apparent nodule, left upper lobe lingula. Lungs otherwise clear. No pleural effusion or pneumothorax. Skeletal structures are intact. IMPRESSION: 1. No acute cardiopulmonary disease. 2. Suspected 9 mm left upper lobe nodule. Recommend non emergent follow-up chest CT without contrast. Electronically Signed   By: Alm Parkins M.D.   On: 02/07/2024 12:37     Procedures   Medications Ordered in the ED - No data to display  Clinical Course as of 02/07/24 1354  Fri Feb 07, 2024  1327 No acute abnormality on work up to explain symptoms. Suspect methamphetamine induced. Patient is medically cleared for psych eval. [VK]    Clinical Course User Index [VK] Kingsley, Jalaysha Skilton K, DO                                 Medical Decision Making This patient presents to the ED with chief complaint(s) of  hallucinations, paranoia with pertinent past medical history of methamphetamine use which further complicates the presenting complaint. The complaint involves an extensive differential diagnosis and also carries with it a high risk of complications and morbidity.    The differential diagnosis includes substance-induced hallucinations, primary psychosis, considering mass effect with history of headaches, infection, electrolyte derangement  Additional history obtained: Additional history obtained from N/A Records reviewed N/A  ED Course and Reassessment: On patient's arrival he is hemodynamically stable in no acute distress, he is not acutely psychotic, suicidal.  He states that he does have homicidal command hallucinations but has no plan.  He is currently agreeable for psychiatry evaluation, does not need IVC.  With his first  time evaluation of hallucinations will have labs and head CT though suspect with his history most likely methamphetamine induced.  He will be closely reassessed.  Independent labs interpretation:  The following labs were independently interpreted: within normal range  Independent visualization of imaging: - I independently visualized the following imaging with scope of interpretation limited to determining acute life threatening conditions related to emergency care: CTH, CXR, which revealed no acute disease  Consultation: - Consulted or discussed management/test interpretation w/ external professional: TTS  Social Determinants of health: methamphetamine use    Amount and/or Complexity of Data Reviewed Labs: ordered. Radiology: ordered.       Final diagnoses:  Hallucinations  Paranoia (HCC)  Methamphetamine use  Pulmonary nodule    ED Discharge Orders     None          Ellouise Richerd POUR, DO 02/07/24 1354

## 2024-02-07 NOTE — Progress Notes (Signed)
 Pt has been accepted to [facility] on 02/07/2024 . Bed assignment:406-1   Pt meets inpatient criteria per Jerel Gravely, NP   Attending Physician will be Dr. Raliegh    Report can be called to: Adult unit: [phone]  Pt can arrive at 2000  Care Team Notified: [facility] [place], RN, Raymar Tamea Sands, RN, Jerel Gravely, NP

## 2024-02-07 NOTE — ED Notes (Addendum)
 [facility] called, ETA 10 minutes

## 2024-02-07 NOTE — ED Notes (Signed)
 NP Mannie at bedside

## 2024-02-07 NOTE — ED Notes (Signed)
Pt wanded by security in triage. 

## 2024-02-07 NOTE — ED Notes (Signed)
 Patient transported to X-ray

## 2024-02-07 NOTE — ED Notes (Addendum)
 [facility] arrived, belongings sent with patient

## 2024-02-07 NOTE — Tx Team (Signed)
 Initial Treatment Plan 02/07/2024 10:08 PM Chad Greene FMW:[id number]    PATIENT STRESSORS: Financial difficulties   Substance abuse     PATIENT STRENGTHS: Ability for insight  Communication skills    PATIENT IDENTIFIED PROBLEMS: Homelessness  Drug abuse  AVH                 DISCHARGE CRITERIA:  Improved stabilization in mood, thinking, and/or behavior Motivation to continue treatment in a less acute level of care  PRELIMINARY DISCHARGE PLAN: Attend 12-step recovery group  PATIENT/FAMILY INVOLVEMENT: This treatment plan has been presented to and reviewed with the , Chad Greene,.  The patient have been given the opportunity to ask questions and make suggestions.  Chad DELENA Land, RN 02/07/2024, 10:08 PM

## 2024-02-07 NOTE — Progress Notes (Signed)
 Admission Note:patient is a VOL 53 year old male from .Pt came in due to a AVH in relation to long term substance abuse.Pt was calm and cooperative during assessment. Denies SI and endorsed HI/AVH. Admission plan of care reviewed with pt, consent signed.  Personal belongings/skin assessment completed.   No contraband found. Pt had a scratch on his left knee which was in the healing stage.  Patient oriented to the unit, staff and room.  Routine safety checks initiated.  Verbalizes understanding of unit rules/protocols.   Patient is presently safe on the unit. No unsafe behaviors noted.  Q 15 minute safety checks maintained per unit protocol.

## 2024-02-07 NOTE — Consult Note (Addendum)
 [facility] Psychiatric Consult Initial  Patient Name: .Chad Greene  MRN: [id number]  DOB: [birth year]  Consult Order details:  Orders (From admission, onward)     Start     Ordered   02/07/24 1353  CONSULT TO CALL ACT TEAM       Ordering Provider: Ellouise Richerd POUR, DO  Provider:  (Not yet assigned)  Question:  Reason for Consult?  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 Caucasian male with a past psychiatric history that includes none, with pertinent medical comorbidities/history that include obesity, who presents this encounter by way of self, for concerns for paranoia and hallucinations in the context of methamphetamine use, who upon EP evaluation, consulted psychiatry for specialty evaluation and recommendations.  Patient is currently voluntary, as well as medically clear, per EDP team.  Upon evaluation, patient presents with symptomology that is most consistent with methamphetamine induced psychotic disorder with severe use disorder.  Evidence of this is appreciable from examination conducted, where patient endorses that he has been struggling with methamphetamine abuse over the last 3 to 4 years, which has led to a variety of severe psychosocial stress, tolerance, legal problems, and now psychotic features.  Given investigation conducted, recommendation is for inpatient mental health hospitalization, for safety and stabilization of the patient, as well as utilization of low-dose of antipsychotic, to which patient endorsed that he was amenable to this.  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 daily  ## Medical Decision Making Capacity: Not specifically addressed in this encounter  ## Further Work-up: UA, UDS, EKG for QTc monitoring  ## Disposition:--Recommend inpatient mental health hospitalization, voluntary   ## Behavioral / Environmental: -Routine agitation/safety precautions    ## Safety and Observation Level:  - Based on my clinical evaluation, I estimate the patient to be at low risk of self harm in the current setting. - At this time, we recommend  routine. This decision is based on my review of the chart including patient's history and current presentation, interview of the patient, mental status examination, and consideration of suicide risk including evaluating suicidal ideation, plan, intent, suicidal or self-harm behaviors, risk factors, and protective factors. This judgment is based on our ability to directly address suicide risk, implement suicide prevention strategies, and develop a safety plan while the patient is in the clinical setting. Please contact our team if there is a concern that risk level has changed.  CSSR Risk Category:C-SSRS RISK CATEGORY: No Risk  Suicide Risk Assessment: Patient has following modifiable risk factors for suicide: social isolation, recklessness, lack of access to outpatient mental health resources, active mental illness (to encompass adhd, tbi, mania, psychosis, trauma reaction), current symptoms: anxiety/panic, insomnia, impulsivity, anhedonia, hopelessness, and triggering events, which we are addressing by recommendations. Patient has following non-modifiable or demographic risk factors for suicide: male gender Patient has the following protective factors  against suicide: Frustration tolerance, no history of  suicide attempts, and no history of NSSIB  Thank you for this consult request. Recommendations have been communicated to the primary team.  We will continue to follow at this time.   Jerel JINNY Gravely, NP       History of Present Illness   Chad Greene is a 53 y.o. Caucasian male with a past psychiatric history that includes none, with pertinent medical comorbidities/history that include obesity, who presents this encounter by way of self, for concerns for paranoia and hallucinations in the context of methamphetamine use, who upon EP evaluation, consulted psychiatry for specialty evaluation and recommendations.  Patient is currently voluntary, as well as medically clear, per EDP team.  Patient seen today at the [facility] emergency department for face-to-face psychiatric evaluation.  Upon evaluation, patient endorses that he brought himself in, because in the context of heavy methamphetamine use, he has begun experiencing progressively worsening paranoia and hallucinations, and has a desire to now get clean.  Expanding on methamphetamine use, patient endorses that for around 3 to 4 years now he has been progressively using methamphetamine more and more each day, when he states that around 2 months ago, in the context of using around 3 g/day, and transitioning into nearly every day use, he began to experience the development of hallucinations whispering to him that people are after him and trying to harm him, as well as internal feelings of paranoia.  During examination, patient endorses that currently he is experiencing mild paranoia about his surroundings and this provider speaking to him, but endorses that he is not presently experiencing hearing voices telling him that people are out to harm him, and objectively, he presents with a reserved interpersonal style with paranoid edge, a neutral affect, and brief eye contact, but does not appear to be responding to internal and or external  stimuli, and his orientation is intact, without concerns for fluctuations in consciousness.  Patient endorses that he does not have a desire to harm himself or anyone else, and denies a history of harming others and or himself, but expands and states that the voices do often at times specifically tell him to harm other people, when he experiences them.  Patient endorses that outside of methamphetamine use, he has not used other drugs, and denies EtOH use, but endorses daily tobacco use in the form of about a quarter pack a day; BAL unremarkable, UDS pending.   Patient endorses that his mood currently is, not the greatest, but hang in there.  Patient endorses he has no psychiatric history or medical history, but endorses that he does have a history of incarceration for, armed career criminal, and endorses that he was in prison from 2004-2017.  Patient endorses last methamphetamine use was, probably early yesterday.  Patient endorses that in the context of illicit substance use he does not sleep well, but endorses that he feels that he eats adequately.  Patient endorses no history of rehabilitation services for substance use.  Discussed with patient that given investigation conducted, recommendation would be for inpatient mental health hospitalization, as well as to start a low-dose of olanzapine, to which patient endorsed he was amenable to this.  Review of Systems  Constitutional:  Positive for malaise/fatigue. Negative for weight loss.  Gastrointestinal:  Negative for abdominal pain, constipation, diarrhea, nausea and vomiting.  Neurological:  Negative for dizziness, tingling, tremors, seizures, loss of consciousness and headaches.  Psychiatric/Behavioral:  Positive for hallucinations (Reports he has been  hearing voices) and substance abuse (Meth). Negative for depression and suicidal ideas. The patient is nervous/anxious and has insomnia.   All other systems reviewed and are negative.     Psychiatric and Social History  Psychiatric History:  Information collected from chart review/patient  Prev Dx/Sx: None Current Psych Provider: None Home Meds (current): None Previous Med Trials: None Therapy: None  Prior Psych Hospitalization: None  Prior Self Harm: None Prior Violence: None  Family Psych History: None Family Hx suicide: None  Social History:  Developmental Hx: None Educational Hx: None Occupational Hx: Works part-time for [employer] Hx: Incarcerated from 2004-2017 for armed career criminal  Living Situation: Homeless on and off for about the last year Spiritual Hx: None reported Access to weapons/lethal means: None  Substance History Alcohol: None reported Tobacco: Daily many years Illicit drugs: Methamphetamines the last 3 to 4 years Prescription drug abuse: None reported Rehab hx: None reported  Exam Findings  Physical Exam: As below Vital Signs:  Temp:  [98.1 F (36.7 C)] 98.1 F (36.7 C) (11/28 1051) Pulse Rate:  [88] 88 (11/28 1051) Resp:  [18] 18 (11/28 1051) BP: (138)/(86) 138/86 (11/28 1051) SpO2:  [97 %] 97 % (11/28 1051) Blood pressure 138/86, pulse 88, temperature 98.1 F (36.7 C), resp. rate 18, SpO2 97%. There is no height or weight on file to calculate BMI.  Physical Exam Vitals and nursing note reviewed.  Constitutional:      General: He is not in acute distress.    Appearance: He is obese. He is not ill-appearing, toxic-appearing or diaphoretic.     Comments: Reserved interpersonal style with paranoid edge   Pulmonary:     Effort: Pulmonary effort is normal.  Skin:    General: Skin is warm and dry.  Neurological:     Mental Status: He is alert and oriented to person, place, and time.     Motor: No weakness, tremor or seizure activity.  Psychiatric:        Attention and Perception: Attention and perception normal. He does not perceive auditory or visual hallucinations.        Behavior: Behavior is  cooperative.        Thought Content: Thought content is paranoid. Thought content is not delusional. Thought content does not include homicidal or suicidal ideation.        Cognition and Memory: Cognition and memory normal.        Judgment: Judgment normal.     Comments: Perception: endorses no current hearing of voices, but has been frequently  Mood: not the greatest lately, but hanging in there Affect: Neutral  Speech: reserved  Behavior: Reserved, paranoid edge       Mental Status Exam: General Appearance: Well-groomed obese Caucasian male in scrubs with reserved interpersonal style with paranoid edge  Orientation:  Full (Time, Place, and Person)  Memory:  Grossly intact  Concentration:  Concentration: Fair and Attention Span: Fair  Recall:  Fair  Attention  Fair  Eye Contact:  Brief  Speech:  Clear and Coherent and reserved rate  Language:  Good  Volume:  Normal  Mood: Not the greatest lately, but hanging in there  Affect:  Neutral  Thought Process:  Coherent, Goal Directed, and Linear, but reserved and superficial  Thought Content:  Logical with expressions of feeling paranoid  Suicidal Thoughts:  No  Homicidal Thoughts:  No  Judgement:  Other:  Largely intact  Insight:  Lacking, Present, and Shallow  Psychomotor Activity:  Normal  Akathisia:  No  Fund of Knowledge:  Grossly intact      Assets:  Communication Skills Desire for Improvement Leisure Time Physical Health Resilience Talents/Skills Vocational/Educational  Cognition:  WNL  ADL's:  Intact  AIMS (if indicated):   0     Other History   These have been pulled in through the EMR, reviewed, and updated if appropriate.  Family History:  The patient's family history is not on file.  Medical History: Past Medical History:  Diagnosis Date   Lumbar vertebral fracture [facility])     Surgical History: History reviewed. No pertinent surgical history.   Medications:  No current facility-administered  medications for this encounter.  Current Outpatient Medications:    doxycycline  (VIBRAMYCIN ) 100 MG capsule, Take 1 capsule (100 mg total) by mouth 2 (two) times daily., Disp: 20 capsule, Rfl: 0   erythromycin  ophthalmic ointment, Place a 1/2 inch ribbon of ointment into the lower eyelid 4 times daily for 7 days., Disp: 3.5 g, Rfl: 0   ibuprofen (ADVIL,MOTRIN) 200 MG tablet, Take 800 mg by mouth every 6 (six) hours as needed for moderate pain., Disp: , Rfl:    oxyCODONE -acetaminophen  (PERCOCET/ROXICET) 5-325 MG tablet, Take 1 tablet by mouth every 8 (eight) hours as needed for severe pain., Disp: 12 tablet, Rfl: 0  Allergies: No Known Allergies  Jerel JINNY Gravely, NP

## 2024-02-08 DIAGNOSIS — F15259 Other stimulant dependence with stimulant-induced psychotic disorder, unspecified: Secondary | ICD-10-CM

## 2024-02-08 DIAGNOSIS — F411 Generalized anxiety disorder: Secondary | ICD-10-CM

## 2024-02-08 LAB — LIPID PANEL
Cholesterol: 149 mg/dL (ref 0–200)
HDL: 33 mg/dL — ABNORMAL LOW (ref >40)
LDL Cholesterol: 101 mg/dL — ABNORMAL HIGH (ref 0–99)
Total CHOL/HDL Ratio: 4.6 ratio
Triglycerides: 76 mg/dL (ref <150)
VLDL: 15 mg/dL (ref 0–40)

## 2024-02-08 MED ORDER — ENSURE PLUS HIGH PROTEIN PO LIQD
237.0000 mL | Freq: Two times a day (BID) | ORAL | Status: DC
  Administered 2024-02-08: 237 mL via ORAL
  Filled 2024-02-08 (×7): qty 237

## 2024-02-08 MED ORDER — GUAIFENESIN ER 600 MG PO TB12
600.0000 mg | ORAL_TABLET | Freq: Two times a day (BID) | ORAL | Status: AC
  Administered 2024-02-08 – 2024-02-13 (×10): 600 mg via ORAL
  Filled 2024-02-08 (×10): qty 1

## 2024-02-08 MED ORDER — TRAZODONE HCL 100 MG PO TABS
100.0000 mg | ORAL_TABLET | Freq: Every day | ORAL | Status: DC
  Administered 2024-02-08 – 2024-02-13 (×6): 100 mg via ORAL
  Filled 2024-02-08 (×4): qty 1
  Filled 2024-02-08: qty 7
  Filled 2024-02-08 (×2): qty 1

## 2024-02-08 MED ORDER — GABAPENTIN 100 MG PO CAPS
200.0000 mg | ORAL_CAPSULE | Freq: Three times a day (TID) | ORAL | Status: DC
  Administered 2024-02-08 – 2024-02-09 (×4): 200 mg via ORAL
  Filled 2024-02-08 (×4): qty 2

## 2024-02-08 NOTE — Group Note (Signed)
 Date:  02/08/2024 Time:  8:47 PM  Group Topic/Focus:  Wrap-Up Group:   The focus of this group is to help patients review their daily goal of treatment and discuss progress on daily workbooks.    Participation Level:  Did Not Attend  Participation Quality:  Resistant  Affect:  Resistant  Cognitive:  Lacking  Insight: None  Engagement in Group:  None  Modes of Intervention:  Discussion  Additional Comments:  Patient did not participate in wrap up group  Chad Greene 02/08/2024, 8:47 PM

## 2024-02-08 NOTE — Progress Notes (Signed)
(  Sleep Hours) -8 (Any PRNs that were needed, meds refused, or side effects to meds)- none (Any disturbances and when (visitation, over night)-none (Concerns raised by the patient)- none (SI/HI/AVH)-endorse AVH

## 2024-02-08 NOTE — Group Note (Signed)
 Date:  02/08/2024 Time:  9:25 AM  Group Topic/Focus:  Goals Group:   The focus of this group is to help patients establish daily goals to achieve during treatment and discuss how the patient can incorporate goal setting into their daily lives to aide in recovery.    Participation Level:  Did Not Attend   Parminder Cupples A Keyonda Bickle 02/08/2024, 9:25 AM

## 2024-02-08 NOTE — [facility] Suicide Risk Assessment (Signed)
 Suicide Risk Assessment  Admission Assessment    [facility] Admission Suicide Risk Assessment   Nursing information obtained from:  Patient  Demographic factors:  Male, Unemployed, Low socioeconomic status  Current Mental Status:  Thoughts of violence towards others  Loss Factors:  Financial problems / change in socioeconomic status  Historical Factors:  NA  Risk Reduction Factors:  Positive social support  Total Time spent with patient: 1.5 hours including H&P.  Principal Problem: Methamphetamine-induced psychotic disorder (HCC)  Diagnosis:  Principal Problem:   Methamphetamine-induced psychotic disorder (HCC)  Subjective Data: See H&P.  Continued Clinical Symptoms:  Alcohol Use Disorder Identification Test Final Score (AUDIT): 5 The Alcohol Use Disorders Identification Test, Guidelines for Use in Primary Care, Second Edition.  World Science Writer [facility]). Score between 0-7:  no or low risk or alcohol related problems. Score between 8-15:  moderate risk of alcohol related problems. Score between 16-19:  high risk of alcohol related problems. Score 20 or above:  warrants further diagnostic evaluation for alcohol dependence and treatment.  CLINICAL FACTORS:   Alcohol/Substance Abuse/Dependencies Currently Psychotic  Musculoskeletal: Strength & Muscle Tone: within normal limits Gait & Station: normal Patient leans: N/A  Psychiatric Specialty Exam:  Presentation  General Appearance: Casual; Fairly Groomed (Tattoos to scap/arm areas.)  Eye Contact:Fair  Speech:Clear and Coherent; Normal Rate  Speech Volume:Decreased  Handedness:Right   Mood and Affect  Mood:Anxious; Depressed  Affect:Congruent; Flat   Thought Process  Thought Processes:Coherent; Linear; Goal Directed  Descriptions of Associations:Intact  Orientation:Full (Time, Place and Person)  Thought Content:Logical  History of Schizophrenia/Schizoaffective disorder:No data recorded Duration of  Psychotic Symptoms:No data recorded Hallucinations:Hallucinations: Auditory Description of Auditory Hallucinations: Telling me to hurt people/myself or they will do me in.  Ideas of Reference:Paranoia  Suicidal Thoughts:Suicidal Thoughts: No  Homicidal Thoughts:Homicidal Thoughts: No   Sensorium  Memory:Immediate Fair; Recent Fair; Remote Fair  Judgment:Impaired  Insight:Fair   Executive Functions  Concentration:Fair  Attention Span:No data recorded Recall:Fair  Fund of Knowledge:Fair  Language:Good  Psychomotor Activity  Psychomotor Activity:Psychomotor Activity: Normal  Assets  Assets:Communication Skills; Desire for Improvement; Financial Resources/Insurance; Housing; Physical Health; Resilience; Social Support  Sleep  Sleep:Sleep: Poor Number of Hours of Sleep: 3  Physical Exam: See H&P  Blood pressure 125/72, pulse 71, resp. rate 18, height 6' (1.829 m), weight 119.1 kg, SpO2 90%. Body mass index is 35.61 kg/m.  COGNITIVE FEATURES THAT CONTRIBUTE TO RISK:  Closed-mindedness, Polarized thinking, and Thought constriction (tunnel vision)    SUICIDE RISK:   Mild:  Suicidal ideation of limited frequency, intensity, duration, and specificity.  There are no identifiable plans, no associated intent, mild dysphoria and related symptoms, good self-control (both objective and subjective assessment), few other risk factors, and identifiable protective factors, including available and accessible social support.  PLAN OF CARE: See H&P.  I certify that inpatient services furnished can reasonably be expected to improve the patient's condition.   Mac Bolster, NP, pmhnp, fnp-bc. 02/08/2024, 1:28 PM

## 2024-02-08 NOTE — Progress Notes (Signed)
   02/08/24 1500  Psych Admission Type (Psych Patients Only)  Admission Status Voluntary  Psychosocial Assessment  Patient Complaints Anxiety;Restlessness  Eye Contact Brief  Facial Expression Flat  Affect Sad;Anxious  Speech Soft  Interaction Guarded  Motor Activity Slow  Appearance/Hygiene Unremarkable  Behavior Characteristics Cooperative;Anxious  Mood Anxious  Thought Process  Coherency WDL  Content Paranoia  Delusions Paranoid  Perception Hallucinations  Hallucination Command;Auditory  Judgment Limited  Confusion WDL  Danger to Self  Current suicidal ideation? Denies  Description of Suicide Plan No Plan  Agreement Not to Harm Self Yes  Description of Agreement Verbal  Danger to Others  Danger to Others None reported or observed

## 2024-02-08 NOTE — Group Note (Signed)
 Date:  02/08/2024 Time:  4:43 PM  Group Topic/Focus:   Dopamine: Effect on Mind and Body    Participation Level:  Did Not Attend     Chad Greene 02/08/2024, 4:43 PM

## 2024-02-08 NOTE — Plan of Care (Signed)
   Problem: Education: Goal: Verbalization of understanding the information provided will improve Outcome: Progressing   Problem: Activity: Goal: Interest or engagement in activities will improve Outcome: Not Progressing

## 2024-02-08 NOTE — Group Note (Signed)
 Date:  02/08/2024 Time:  10:20 AM  Group Topic/Focus:  Dimensions of Wellness:   The focus of this group is to introduce the topic of wellness and discuss the role each dimension of wellness plays in total health.    Participation Level:  Did Not Attend   [name] 02/08/2024, 10:20 AM

## 2024-02-08 NOTE — Group Note (Signed)
 LCSW Group Therapy Note  Group Date: 02/08/2024 Start Time: 0100 End Time: 0451   Type of Therapy and Topic:  Group Therapy: Positive Affirmations  Participation Level:  Did Not Attend   Description of Group:   This group addressed positive affirmation towards self and others.  Patients went around the room and identified two positive things about themselves and two positive things about a peer in the room.  Patients reflected on how it felt to share something positive with others, to identify positive things about themselves, and to hear positive things from others/ Patients were encouraged to have a daily reflection of positive characteristics or circumstances.   Therapeutic Goals: Patients will verbalize two of their positive qualities Patients will demonstrate empathy for others by stating two positive qualities about a peer in the group Patients will verbalize their feelings when voicing positive self affirmations and when voicing positive affirmations of others Patients will discuss the potential positive impact on their wellness/recovery of focusing on positive traits of self and others.  Summary of Patient Progress:  NA  Therapeutic Modalities:   Cognitive Behavioral Therapy Motivational Interviewing    Evalene MALVA Quale, LCSWA 02/08/2024  4:57 PM

## 2024-02-08 NOTE — Plan of Care (Signed)
   Problem: Education: Goal: Knowledge of [place] [facility] Education information/materials will improve Outcome: Progressing Goal: Emotional status will improve Outcome: Progressing Goal: Mental status will improve Outcome: Progressing

## 2024-02-08 NOTE — H&P (Signed)
 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 Patient is admitted to the [facility] from the [facility] hospital with complaints of drug induced paranoia & auditory hallucinations telling him to hurt other people. After medical evaluation/clearance, patient was recommended for inpatient psychiatric admission & was transferred to the [facility] for evaluation. A review of his current lab results has shown his UDS was positive for methamphetamine & THC. Patient is noted with some frequent coughing episodes. During this evaluation, patient reports,   My cousin took me to the [facility] hospital ED yesterday. I had informed her that some people has been following me. They were men & women. They follow me everywhere because I see them. This has been going for 6 weeks. I do not know these people. I have not done any businesses or interactions with them. They were telling me to hurt other people or they will do me in. They were also telling me to hurt myself. They were following me & hanging around my house. My cousin took me to the hospital because she thought that I was tripping. She thought I was using meth which I was. I have been using methamphetamine for 4 years. I used it everyday. I smoked it & at times, I will inject it into my veins. I do  smoke weed as well. I do these drugs to help me relax because I have been renovating my house. At the end of the day, I will chill out by using meth & smoking weed. The last time I used meth was this past Tuesday. I was not thinking about hurting anyone or myself. I would like to go to a drug rehabilitation center after discharge. I'm not really depressed. I was using meth because it kept me up & going. I have a lot of anxiety & I do not sleep at at night. Patient currently denies any SIHI, However, he continues to endorse auditory hallucinations, paranoid ideations & worsening anxiety symptoms. Rates depression #10. Besides the olanzapine 5 mg Q hs already in progress, patient is in agreement to try gabapentin 200 mg tid for substance withdrawal syndrome. His Trazodone is increased from 50 mg to 100 mg po Q hs routinely. He is interested in going to a drug rehab after discharge.  Associated Signs/Symptoms:  Depression Symptoms:  depressed mood, insomnia, difficulty concentrating, anxiety,  (Hypo) Manic Symptoms:  Delusions, Hallucinations, Impulsivity,  Anxiety Symptoms:  Excessive Worry,  Psychotic Symptoms:  Delusions, Hallucinations: Auditory Paranoia,  PTSD Symptoms: NA  Total Time spent with patient: 1.5 hours  Past Psychiatric History: Other hx of cocaine use & methamphetamine use x 4 years, patient denies any hx of mental illnesses or treatments.  Is the patient at risk to self? No.  Has the patient been a risk to self in the past 6 months? No.  Has the patient been a risk to self within the distant past? No.  Is the patient a risk to  others? No.(Denies). Has the patient been a risk to others in the past 6 months? No.  Has the patient been a risk to others within the distant past? No.   Columbia Scale:  Flowsheet Row [facility] (Current[facility] Most recent reading at 02/07/2024  9:48 PM ED from 02/07/2024 in [facility] at [facility] Most recent reading at 02/07/2024 10:59 AM UC from 01/01/2024 in [facility] at [facility] Most recent reading at 01/01/2024  2:17 PM  C-SSRS RISK CATEGORY No Risk No Risk No Risk   Prior Inpatient Therapy: No. If yes, describe: No  Prior Outpatient Therapy: No. If yes, describe: No   Alcohol Screening: Patient refused Alcohol Screening Tool: Yes 1. How often do you have a drink containing alcohol?: 2 to 3 times a week 2. How many drinks containing alcohol do you have on a typical day when you are drinking?: 1 or 2 3. How often do you have six or more drinks on one occasion?: Monthly AUDIT-C Score: 5 4. How often during the last year have you found that you were not able to stop drinking once you had started?: Never 5. How often during the last year have you failed to do what was normally expected from you because of drinking?: Never 6. How often during the last year have you needed a first drink in the morning to get yourself going after a heavy drinking session?: Never 7. How often during the last year have you had a feeling of guilt of remorse after drinking?: Never 8. How often during the last year have you been unable to remember what happened the night before because you had been drinking?: Never 9. Have you or someone else been injured as a result of your drinking?: No 10. Has a relative or friend or a doctor or another health worker been concerned about your drinking or suggested you cut down?: No Alcohol Use Disorder Identification Test Final Score (AUDIT): 5  Substance Abuse History in the last 12 months:  Yes.    Consequences of Substance Abuse: Discussed with patient during this admission evaluation. Medical Consequences:  Liver damage, Possible death by overdose Legal Consequences:  Arrests, jail time, Loss of driving privilege. Family Consequences:  Family discord, divorce and or  separation.  Previous Psychotropic Medications: No   Psychological Evaluations: Yes   Past Medical History:  Past Medical History:  Diagnosis Date   Lumbar vertebral fracture (HCC)    History reviewed. No pertinent surgical history.  Family History: History reviewed. No pertinent family history.  Family Psychiatric  History: Reports,  Major depressive disorder: Mother.  Major depressive disorders: Sisters.  Tobacco Screening:  Social History   Tobacco Use  Smoking Status Every Day   Types: Cigarettes  Smokeless Tobacco Former    BH Tobacco Counseling     Are you interested in Tobacco Cessation Medications?  No value filed. Counseled patient on smoking cessation:  No value filed. Reason Tobacco Screening Not Completed: No value filed.       Social History: Single, has one son. Lives in [place], [place]. Currently unemployed. Social History   Substance and Sexual Activity  Alcohol Use Yes     Social History   Substance and Sexual Activity  Drug Use Yes   Types: Amphetamines   Comment: Last 3-4 years    Additional Social History:  Allergies:  No Known Allergies  Lab Results:  Results  for orders placed or performed during the hospital encounter of 02/07/24 (from the past 48 hours)  Lipid panel     Status: Abnormal   Collection Time: 02/08/24  6:37 AM  Result Value Ref Range   Cholesterol 149 0 - 200 mg/dL    Comment:        ATP III CLASSIFICATION:  <200     mg/dL   Desirable  799-760  mg/dL   Borderline High  >=759    mg/dL   High           Triglycerides 76 <150 mg/dL   HDL 33 (L) >59 mg/dL   Total CHOL/HDL Ratio 4.6 RATIO   VLDL 15 0 - 40 mg/dL   LDL Cholesterol 898 (H) 0 - 99 mg/dL    Comment:        Total Cholesterol/HDL:CHD Risk Coronary Heart Disease Risk Table                     Men   Women  1/2 Average Risk   3.4   3.3  Average Risk       5.0   4.4  2 X Average Risk   9.6   7.1  3 X Average Risk  23.4   11.0        Use the calculated  Patient Ratio above and the CHD Risk Table to determine the patient's CHD Risk.        ATP III CLASSIFICATION (LDL):  <100     mg/dL   Optimal  899-870  mg/dL   Near or Above                    Optimal  130-159  mg/dL   Borderline  839-810  mg/dL   High  >809     mg/dL   Very High Performed at [facility], [address] [place], [place] [address]    Blood Alcohol level:  Lab Results  Component Value Date   [facility] <15 02/07/2024   ETH <10 11/10/2019   Metabolic Disorder Labs:  No results found for: HGBA1C, MPG No results found for: PROLACTIN Lab Results  Component Value Date   CHOL 149 02/08/2024   TRIG 76 02/08/2024   HDL 33 (L) 02/08/2024   CHOLHDL 4.6 02/08/2024   VLDL 15 02/08/2024   LDLCALC 101 (H) 02/08/2024   Current Medications: Current Facility-Administered Medications  Medication Dose Route Frequency Provider Last Rate Last Admin   acetaminophen  (TYLENOL ) tablet 650 mg  650 mg Oral Q6H PRN Mannie Jerel PARAS, NP       alum & mag hydroxide-simeth (MAALOX/MYLANTA) 200-200-20 MG/5ML suspension 30 mL  30 mL Oral Q4H PRN Mannie Jerel PARAS, NP       haloperidol (HALDOL) tablet 5 mg  5 mg Oral TID PRN Mannie Jerel PARAS, NP       And   diphenhydrAMINE (BENADRYL) capsule 50 mg  50 mg Oral TID PRN Mannie Jerel PARAS, NP       haloperidol lactate (HALDOL) injection 10 mg  10 mg Intramuscular TID PRN Mannie Jerel PARAS, NP       And   diphenhydrAMINE (BENADRYL) injection 50 mg  50 mg Intramuscular TID PRN Stevens, Terry J, NP       And   LORazepam (ATIVAN) injection 2 mg  2 mg Intramuscular TID PRN Mannie Jerel PARAS, NP       haloperidol lactate (HALDOL) injection 5 mg  5  mg Intramuscular TID PRN Mannie Jerel PARAS, NP       And   diphenhydrAMINE (BENADRYL) injection 50 mg  50 mg Intramuscular TID PRN Stevens, Terry J, NP       And   LORazepam (ATIVAN) injection 2 mg  2 mg Intramuscular TID PRN Mannie Jerel PARAS, NP       feeding supplement (ENSURE PLUS  HIGH PROTEIN) liquid 237 mL  237 mL Oral BID BM Towana Leita SAILOR, MD   237 mL at 02/08/24 0851   gabapentin (NEURONTIN) capsule 200 mg  200 mg Oral TID Rodriques Badie I, NP       hydrOXYzine (ATARAX) tablet 25 mg  25 mg Oral TID PRN Ajibola, Ene A, NP       magnesium hydroxide (MILK OF MAGNESIA) suspension 30 mL  30 mL Oral Daily PRN Mannie Jerel PARAS, NP       nicotine (NICODERM CQ - dosed in mg/24 hours) patch 21 mg  21 mg Transdermal Q0600 Mannie Jerel PARAS, NP       OLANZapine (ZYPREXA) tablet 5 mg  5 mg Oral Daily Mannie Jerel PARAS, NP   5 mg at 02/08/24 0851   traZODone (DESYREL) tablet 100 mg  100 mg Oral QHS Brianna Bennett I, NP       PTA Medications: Medications Prior to Admission  Medication Sig Dispense Refill Last Dose/Taking   Multiple Vitamin (MULTIVITAMIN WITH MINERALS) TABS tablet Take 1 tablet by mouth daily.   Past Week    AIMS:  ,  ,  ,  ,  ,  ,    Musculoskeletal: Strength & Muscle Tone: within normal limits Gait & Station: normal Patient leans: N/A  Psychiatric Specialty Exam:  Presentation  General Appearance: Casual; Fairly Groomed (Tattoos to scap/arm areas.)  Eye Contact:Fair  Speech:Clear and Coherent; Normal Rate  Speech Volume:Decreased  Handedness:Right   Mood and Affect  Mood:Anxious; Depressed  Affect:Congruent; Flat   Thought Process  Thought Processes:Coherent; Linear; Goal Directed  Duration of Psychotic Symptoms:Reports auditory hallucinations & delusional thoughts.  Past Diagnosis of Schizophrenia or Psychoactive disorder: No data recorded  Descriptions of Associations:Intact  Orientation:Full (Time, Place and Person)  Thought Content:Logical  Hallucinations:Hallucinations: Auditory Description of Auditory Hallucinations: Telling me to hurt people/myself or they will do me in.  Ideas of Reference:Paranoia  Suicidal Thoughts:Suicidal Thoughts: No  Homicidal Thoughts:Homicidal Thoughts: No   Sensorium  Memory:Immediate Fair;  Recent Fair; Remote Fair  Judgment:Impaired  Insight:Fair   Executive Functions  Concentration:Fair  Attention Span:No data recorded Recall:Fair  Fund of Knowledge:Fair  Language:Good   Psychomotor Activity  Psychomotor Activity:Psychomotor Activity: Normal   Assets  Assets:Communication Skills; Desire for Improvement; Financial Resources/Insurance; Housing; Physical Health; Resilience; Social Support   Sleep  Sleep:Sleep: Poor  Estimated Sleeping Duration (Last 24 Hours): 8.50-9.00 hours   Physical Exam: Physical Exam Vitals and nursing note reviewed.  HENT:     Head: Normocephalic.     Nose: Nose normal.     Mouth/Throat:     Pharynx: Oropharynx is clear.  Cardiovascular:     Rate and Rhythm: Normal rate.     Pulses: Normal pulses.  Pulmonary:     Effort: Pulmonary effort is normal.  Genitourinary:    Comments: Deferred. Musculoskeletal:        General: Normal range of motion.     Cervical back: Normal range of motion.  Skin:    General: Skin is dry.  Neurological:     General: No focal deficit present.  Mental Status: He is alert and oriented to person, place, and time.    Review of Systems  Constitutional:  Negative for chills, diaphoresis and fever.  HENT:  Negative for congestion and sore throat.   Respiratory:  Negative for cough, shortness of breath and wheezing.   Cardiovascular:  Negative for chest pain and palpitations.  Gastrointestinal:  Negative for abdominal pain, constipation, diarrhea, heartburn, nausea and vomiting.  Musculoskeletal:  Negative for joint pain and myalgias.  Skin:  Negative for itching and rash.  Neurological:  Negative for dizziness, tingling, tremors, sensory change, speech change, focal weakness, seizures, loss of consciousness, weakness and headaches.  Endo/Heme/Allergies:        Allergies: NKDA.  Psychiatric/Behavioral:  Positive for hallucinations and substance abuse (UDS (+) for methamphetamine/THC.).  Negative for memory loss and suicidal ideas. The patient is nervous/anxious and has insomnia.    Blood pressure 125/72, pulse 71, resp. rate 18, height 6' (1.829 m), weight 119.1 kg, SpO2 90%. Body mass index is 35.61 kg/m.  Treatment Plan Summary: Daily contact with patient to assess and evaluate symptoms and progress in treatment and Medication management.   Principal/active diagnoses.  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   Hx. Cocaine use disorder.  Plan: The risks/benefits/side-effects/alternatives to the medications in use were discussed in detail with the patient and time was given for patient's questions. The patient consents to medication trial.   Psychosis;  -Continue Olanzapine 5 mg po Q bedtime.   Substance withdrawal syndrome.  -Initiated Gabapentin 200 mg po tid.   Insomnia.  -Increased Trazodone to 100 mg po Q hs.   -Continue.  Hydroxyzine 25 mg po tid prn for anxiety.  Continue Ensure plus 237 ml bid in between meals.  -Continue Nicoderm CQ 21 mg trans-dermally Q 24 hours for nicotine withdrawal. -Mucinex 600 mg po bid x 5 days for cough.   Agitation protocols.  -Continue as recommended (see MAR).  Other PRNS -Continue Tylenol  650 mg every 6 hours PRN for mild pain -Continue Maalox 30 ml Q 4 hrs PRN for indigestion -Continue MOM 30 ml po Q 6 hrs for constipation  Safety and Monitoring: Voluntary admission to inpatient psychiatric unit for safety, stabilization and treatment Daily contact with patient to assess and evaluate symptoms and progress in treatment Patient's case to be discussed in multi-disciplinary team meeting Observation Level : q15 minute checks Vital signs: q12 hours Precautions: Safety  Discharge Planning: Social work and case management to assist with discharge planning and identification of hospital follow-up needs prior to discharge Estimated LOS: 5-7  days Discharge Concerns: Need to establish a safety plan; Medication compliance and effectiveness Discharge Goals: Return home with outpatient referrals for mental health follow-up including medication management/psychotherapy  Observation Level/Precautions:  15 minute checks  Laboratory:  Per ED. Current lab results reviewed.  Psychotherapy: Enrolled.  Medications: See MAR.   Consultations: As needed.   Discharge Concerns: Safety, mood stability.  Estimated LOS: 3-5 days.  Other: NA.   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)  Long Term Goal(s): Improvement in symptoms so as ready for discharge  Short Term Goals: Ability to identify changes in lifestyle to reduce recurrence of condition will improve, Ability to verbalize feelings will improve, Ability to disclose and discuss suicidal ideas, and Ability to demonstrate self-control will improve  Physician Treatment Plan for Secondary Diagnosis: Principal Problem:   Methamphetamine-induced psychotic disorder with moderate or  severe use disorder (HCC) Active Problems:   Methamphetamine-induced psychotic disorder (HCC)   Generalized anxiety disorder  Long Term Goal(s): Improvement in symptoms so as ready for discharge  Short Term Goals: Ability to identify and develop effective coping behaviors will improve, Ability to maintain clinical measurements within normal limits will improve, Compliance with prescribed medications will improve, and Ability to identify triggers associated with substance abuse/mental health issues will improve  I certify that inpatient services furnished can reasonably be expected to improve the patient's condition.    Mac Bolster, NP, pmhnp, fnp-bc. 11/29/20251:36 PM

## 2024-02-08 NOTE — Progress Notes (Signed)
(  Sleep Hours) -9.5  (Any PRNs that were needed, meds refused, or side effects to meds)- none  (Any disturbances and when (visitation, over night)-none  (Concerns raised by the patient)- anxiety  (SI/HI/AVH)-denies

## 2024-02-08 NOTE — Plan of Care (Signed)
   Problem: Education: Goal: Mental status will improve Outcome: Not Progressing
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
## 2024-02-09 LAB — HEMOGLOBIN A1C
Hgb A1c MFr Bld: 5.7 % — ABNORMAL HIGH (ref 4.8–5.6)
Mean Plasma Glucose: 117 mg/dL

## 2024-02-09 MED ORDER — FLUOXETINE HCL 10 MG PO CAPS
10.0000 mg | ORAL_CAPSULE | Freq: Every day | ORAL | Status: DC
  Administered 2024-02-09 – 2024-02-10 (×2): 10 mg via ORAL
  Filled 2024-02-09 (×2): qty 1

## 2024-02-09 MED ORDER — GUAIFENESIN 100 MG/5ML PO LIQD
5.0000 mL | Freq: Once | ORAL | Status: AC | PRN
  Administered 2024-02-09: 5 mL via ORAL
  Filled 2024-02-09: qty 10

## 2024-02-09 MED ORDER — OLANZAPINE 7.5 MG PO TABS
7.5000 mg | ORAL_TABLET | Freq: Every day | ORAL | Status: DC
  Administered 2024-02-10: 7.5 mg via ORAL
  Filled 2024-02-09: qty 1

## 2024-02-09 MED ORDER — GABAPENTIN 100 MG PO CAPS
200.0000 mg | ORAL_CAPSULE | Freq: Three times a day (TID) | ORAL | Status: DC
  Administered 2024-02-09 – 2024-02-10 (×4): 200 mg via ORAL
  Filled 2024-02-09 (×5): qty 2

## 2024-02-09 NOTE — Group Note (Signed)
 Date:  02/09/2024 Time:  9:57 AM  Group Topic/Focus:  Goals Group:   The focus of this group is to help patients establish daily goals to achieve during treatment and discuss how the patient can incorporate goal setting into their daily lives to aide in recovery.    Participation Level:  Did Not Attend   Additional Comments:  Pt was aware that group was beginning but chore not to attend  Adriana GORMAN Blush 02/09/2024, 9:57 AM

## 2024-02-09 NOTE — Progress Notes (Signed)
(  Sleep Hours) -11.5  (Any PRNs that were needed, meds refused, or side effects to meds)-   (Any disturbances and when (visitation, over night)-none  (Concerns raised by the patient)- nonproductive cough  (SI/HI/AVH)-denies all

## 2024-02-09 NOTE — Progress Notes (Signed)
 Patient denies SI/HI/VH this morning. Pt endorses AH reporting the voices are telling me to watch out. Pt endorses high anxiety/depression this morning. Pt reports that he slept well last night. Pt has been interactive on the unit throughout the day. Pt has been calm and cooperative throughout the day. Patient has been compliant with medications and treatment plan. Q 15 minute safety checks are in place for patient's safety. Patient is currently safe on the unit.   02/09/24 0856  Psych Admission Type (Psych Patients Only)  Admission Status Voluntary  Psychosocial Assessment  Patient Complaints Anxiety;Depression;Suspiciousness  Eye Contact Fair  Facial Expression Flat  Affect Depressed  Speech Soft  Interaction Guarded  Motor Activity Slow  Appearance/Hygiene Unremarkable  Behavior Characteristics Cooperative  Mood Depressed;Anxious  Thought Process  Coherency WDL  Content Paranoia  Delusions Paranoid  Perception Hallucinations  Hallucination Auditory (Voices are telling me to watch out)  Judgment Impaired  Confusion None  Danger to Self  Current suicidal ideation? Denies  Description of Suicide Plan N/A  Agreement Not to Harm Self Yes  Description of Agreement Pt verbally contracts for safety  Danger to Others  Danger to Others None reported or observed

## 2024-02-09 NOTE — Group Note (Signed)
 Date:  02/09/2024 Time:  10:05 AM  Group Topic/Focus:  Emotional Education:   The focus of this group is to discuss what feelings/emotions are, and how they are experienced.    Participation Level:  Did Not Attend   Additional Comments:  Pt was aware that group was beginning but chore not to attend  Adriana GORMAN Blush 02/09/2024, 10:05 AM

## 2024-02-09 NOTE — Progress Notes (Signed)
 [facility] MD Progress Note  02/09/2024 1:59 PM Chad Greene  MRN:  [id number]  Reason for admission: 53 year old Caucasian male with no known hx of mental illnesses, hospitalizations or treatments. Patient is admitted to the [facility] from the [facility] hospital with complaints of drug induced paranoia & auditory hallucinations telling him to hurt other people. After medical evaluation/clearance, patient was recommended for inpatient psychiatric admission & was transferred to the [facility] for evaluation. A review of his current lab results has shown his UDS was positive for methamphetamine & THC. Patient is noted with some frequent coughing episodes.   Daily notes: Chad Greene is seen this morning. He presents with a depressed affects. He is making some eye contact. He does come out of his room, however isolates himself from the other patients. He tearfully reports, My mood is not good. I feel sad. I feel like the dude sitting out there was taking my picture earlier. I'm still feeling very anxious. I think I slept some last night, but I don't know how much. I'm still hearing voices inside my head. I don't know what is going on with me. Patient presents highly suspicious & paranoid this morning. However, he does go to the cafeteria for meals with the other patients. He does go to the dayroom, but just to get coffee. He is taking & tolerating his treatment regimen. Report from the nurse today indicated patient did spend a long time in prison for possession of a weapon. His current isolation, paranoid behavior/suspicions could be associated with his experiences in prison as well as the effects of heavy drug use. He is started on a low dose of fluoxetine today for depression & his Olanzapine is increased to 7.5 mg Q hs for psychosis. Patient is instructed & assured that he is safe on this unit. He is explained that no one in this building is allowed to carry a camera/take a picture of anyone here. He currently denies any SIHI  or VH. His vital signs remain stable. Continue current plan of care as noted below.  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  Past Medical History:  Past Medical History:  Diagnosis Date   Lumbar vertebral fracture (HCC)    History reviewed. No pertinent surgical history. Family History: History reviewed. No pertinent family history.  Family Psychiatric  History: See H&P.  Social History:  Social History   Substance and Sexual Activity  Alcohol Use Yes     Social History   Substance and Sexual Activity  Drug Use Yes   Types: Amphetamines   Comment: Last 3-4 years    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   Vaping status: Every Day   Substances: Nicotine, Flavoring  Substance and Sexual Activity   Alcohol use: Yes   Drug use: Yes    Types: Amphetamines  Comment: Last 3-4 years   Sexual activity: Yes    Birth control/protection: Condom  Other Topics Concern   Not on file  Social History Narrative   Homeless   Social Drivers of Health   Financial Resource Strain: Not on file  Food Insecurity: No Food Insecurity (02/07/2024)   Hunger Vital Sign    Worried About Running Out of Food in the Last Year: Never true    Ran Out of Food in the Last Year: Never true  Transportation Needs: Unmet Transportation  Needs (02/07/2024)   PRAPARE - Administrator, Civil Service (Medical): Yes    Lack of Transportation (Non-Medical): Yes  Physical Activity: Not on file  Stress: Not on file  Social Connections: Not on file   Additional Social History:   Sleep: Fair Estimated Sleeping Duration (Last 24 Hours): 7.00-8.00 hours  Appetite:  Good  Current Medications: Current Facility-Administered Medications  Medication Dose Route Frequency Provider Last Rate Last Admin   acetaminophen  (TYLENOL ) tablet 650 mg  650 mg Oral Q6H PRN Mannie Jerel PARAS, NP       alum & mag hydroxide-simeth (MAALOX/MYLANTA) 200-200-20 MG/5ML suspension 30 mL  30 mL Oral Q4H PRN Mannie Jerel PARAS, NP       haloperidol (HALDOL) tablet 5 mg  5 mg Oral TID PRN Mannie Jerel PARAS, NP       And   diphenhydrAMINE (BENADRYL) capsule 50 mg  50 mg Oral TID PRN Mannie Jerel PARAS, NP       haloperidol lactate (HALDOL) injection 10 mg  10 mg Intramuscular TID PRN Mannie Jerel PARAS, NP       And   diphenhydrAMINE (BENADRYL) injection 50 mg  50 mg Intramuscular TID PRN Mannie Jerel PARAS, NP       And   LORazepam (ATIVAN) injection 2 mg  2 mg Intramuscular TID PRN Mannie Jerel PARAS, NP       haloperidol lactate (HALDOL) injection 5 mg  5 mg Intramuscular TID PRN Mannie Jerel PARAS, NP       And   diphenhydrAMINE (BENADRYL) injection 50 mg  50 mg Intramuscular TID PRN Mannie Jerel PARAS, NP       And   LORazepam (ATIVAN) injection 2 mg  2 mg Intramuscular TID PRN Mannie Jerel PARAS, NP       feeding supplement (ENSURE PLUS HIGH PROTEIN) liquid 237 mL  237 mL Oral BID BM Towana Leita SAILOR, MD   237 mL at 02/08/24 0851   FLUoxetine (PROZAC) capsule 10 mg  10 mg Oral Daily Jex Strausbaugh I, NP       gabapentin (NEURONTIN) capsule 200 mg  200 mg Oral TID AC & HS Lakyn Mantione I, NP       guaiFENesin (MUCINEX) 12 hr tablet 600 mg  600 mg Oral BID Kimbley Sprague I, NP   600 mg at 02/09/24 0809   magnesium hydroxide (MILK OF MAGNESIA) suspension 30 mL   30 mL Oral Daily PRN Mannie Jerel PARAS, NP       nicotine (NICODERM CQ - dosed in mg/24 hours) patch 21 mg  21 mg Transdermal Q0600 Mannie Jerel PARAS, NP       [START ON 02/10/2024] OLANZapine (ZYPREXA) tablet 7.5 mg  7.5 mg Oral Daily Carlisha Wisler I, NP       traZODone (DESYREL) tablet 100 mg  100 mg Oral QHS Tyrell Seifer I, NP   100 mg at 02/08/24 2114   Lab Results:  Results for  orders placed or performed during the hospital encounter of 02/07/24 (from the past 48 hours)  Lipid panel     Status: Abnormal   Collection Time: 02/08/24  6:37 AM  Result Value Ref Range   Cholesterol 149 0 - 200 mg/dL    Comment:        ATP III CLASSIFICATION:  <200     mg/dL   Desirable  799-760  mg/dL   Borderline High  >=759    mg/dL   High           Triglycerides 76 <150 mg/dL   HDL 33 (L) >59 mg/dL   Total CHOL/HDL Ratio 4.6 RATIO   VLDL 15 0 - 40 mg/dL   LDL Cholesterol 898 (H) 0 - 99 mg/dL    Comment:        Total Cholesterol/HDL:CHD Risk Coronary Heart Disease Risk Table                     Men   Women  1/2 Average Risk   3.4   3.3  Average Risk       5.0   4.4  2 X Average Risk   9.6   7.1  3 X Average Risk  23.4   11.0        Use the calculated Patient Ratio above and the CHD Risk Table to determine the patient's CHD Risk.        ATP III CLASSIFICATION (LDL):  <100     mg/dL   Optimal  899-870  mg/dL   Near or Above                    Optimal  130-159  mg/dL   Borderline  839-810  mg/dL   High  >809     mg/dL   Very High Performed at [facility], [address], [place], [place] [address]    Blood Alcohol level:  Lab Results  Component Value Date   [facility] <15 02/07/2024   ETH <10 11/10/2019   Metabolic Disorder Labs: No results found for: HGBA1C, MPG No results found for: PROLACTIN Lab Results  Component Value Date   CHOL 149 02/08/2024   TRIG 76 02/08/2024   HDL 33 (L) 02/08/2024   CHOLHDL 4.6 02/08/2024   VLDL 15 02/08/2024   LDLCALC 101 (H)  02/08/2024   Physical Findings: AIMS:  ,  ,  ,  ,  ,  ,   CIWA:    COWS:     Musculoskeletal: Strength & Muscle Tone: within normal limits Gait & Station: normal Patient leans: N/A  Psychiatric Specialty Exam:  Presentation  General Appearance:  Casual; Fairly Groomed (Tattoos to scap/arm areas.)  Eye Contact: Fair  Speech: Clear and Coherent; Normal Rate  Speech Volume: Decreased  Handedness: Right  Mood and Affect  Mood: Anxious; Depressed  Affect: Congruent; Flat   Thought Process  Thought Processes: Coherent; Linear; Goal Directed  Descriptions of Associations:Intact  Orientation:Full (Time, Place and Person)  Thought Content:Logical  History of Schizophrenia/Schizoaffective disorder:No data recorded  Duration of Psychotic Symptoms:No data recorded  Hallucinations:Hallucinations: Auditory Description of Auditory Hallucinations: Telling me to hurt people/myself or they will do me in.  Ideas of Reference:Paranoia  Suicidal Thoughts:Suicidal Thoughts: No  Homicidal Thoughts:Homicidal Thoughts: No  Sensorium  Memory: Immediate Fair; Recent Fair; Remote Fair  Judgment: Impaired  Insight: Fair  Art Therapist  Concentration: Fair  Attention Span:No data recorded Recall: [employer]  of Knowledge: Fair  Language: Good  Psychomotor Activity  Psychomotor Activity: Psychomotor Activity: Normal  Assets  Assets: Communication Skills; Desire for Improvement; Financial Resources/Insurance; Housing; Physical Health; Resilience; Social Support  Sleep  Sleep: Sleep: Fair Number of Hours of Sleep: 6.5  Physical Exam: Physical Exam Vitals and nursing note reviewed.  HENT:     Head: Normocephalic.     Nose: Nose normal.     Mouth/Throat:     Pharynx: Oropharynx is clear.  Cardiovascular:     Rate and Rhythm: Normal rate.     Pulses: Normal pulses.  Pulmonary:     Effort: Pulmonary effort is normal.  Genitourinary:     Comments: Deferred. Musculoskeletal:        General: Normal range of motion.     Cervical back: Normal range of motion.  Skin:    General: Skin is dry.  Neurological:     General: No focal deficit present.     Mental Status: He is alert and oriented to person, place, and time.    Review of Systems  Constitutional:  Negative for chills, diaphoresis and fever.  HENT:  Negative for congestion and sore throat.   Respiratory:  Negative for cough, shortness of breath and wheezing.   Cardiovascular:  Negative for chest pain and palpitations.  Gastrointestinal:  Negative for abdominal pain, constipation, diarrhea, heartburn, nausea and vomiting.  Genitourinary:  Negative for dysuria.  Musculoskeletal:  Negative for joint pain and myalgias.  Skin:  Negative for itching and rash.  Neurological:  Negative for dizziness, tingling, tremors, sensory change, speech change, focal weakness, seizures, loss of consciousness, weakness and headaches.  Endo/Heme/Allergies:        NKDA.  Psychiatric/Behavioral:  Positive for hallucinations and substance abuse. Negative for depression, memory loss and suicidal ideas. The patient is nervous/anxious and has insomnia.    Blood pressure 136/76, pulse 79, temperature (!) 97.5 F (36.4 C), temperature source Oral, resp. rate 16, height 6' (1.829 m), weight 119.1 kg, SpO2 96%. Body mass index is 35.61 kg/m.  Treatment Plan Summary: Daily contact with patient to assess and evaluate symptoms and progress in treatment and Medication management.   Continue inpatient hospitalization.  Will continue today 02/09/2024 plan as below except where it is noted.   Principal/active diagnoses.  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    Hx. Cocaine use disorder.  Plan: The risks/benefits/side-effects/alternatives to the medications in use were discussed in detail with the patient and time was  given for patient's questions. The patient consents to medication trial.    Psychosis;  -Increased Olanzapine to 7.5 mg po Q bedtime.   Depression.  -Initiated fluoxetine 10 mg po daily.   Substance withdrawal syndrome.  -Continue Gabapentin 200 mg po tid.    Insomnia.  -Continue Trazodone 100 mg po Q hs.    -Continue.  Hydroxyzine 25 mg po tid prn for anxiety.  Continue Ensure plus 237 ml bid in between meals.  -Continue Nicoderm CQ 21 mg trans-dermally Q 24 hours for nicotine withdrawal. -Mucinex 600 mg po bid x 5 days for cough.    Agitation protocols.  -Continue as recommended (see MAR).   Other PRNS -Continue Tylenol  650 mg every 6 hours PRN for mild pain -Continue Maalox 30 ml Q 4 hrs PRN for indigestion -Continue MOM 30 ml po Q 6 hrs for constipation   Safety and Monitoring: Voluntary admission to inpatient psychiatric unit for safety, stabilization and treatment Daily contact  with patient to assess and evaluate symptoms and progress in treatment Patient's case to be discussed in multi-disciplinary team meeting Observation Level : q15 minute checks Vital signs: q12 hours Precautions: Safety   Discharge Planning: Social work and case management to assist with discharge planning and identification of hospital follow-up needs prior to discharge Estimated LOS: 5-7 days Discharge Concerns: Need to establish a safety plan; Medication compliance and effectiveness Discharge Goals: Return home with outpatient referrals for mental health follow-up including medication management/psychotherapy  Mac Bolster, NP, pmhnp, fnp-bc. 02/09/2024, 1:59 PM

## 2024-02-09 NOTE — Plan of Care (Signed)
   Problem: Education: Goal: Knowledge of [place] [facility] Education information/materials will improve Outcome: Progressing Goal: Emotional status will improve Outcome: Progressing Goal: Mental status will improve Outcome: Progressing Goal: Verbalization of understanding the information provided will improve Outcome: Progressing

## 2024-02-09 NOTE — Plan of Care (Signed)
   Problem: Education: Goal: Knowledge of [place] [facility] Education information/materials will improve Outcome: Progressing Goal: Emotional status will improve Outcome: Progressing Goal: Mental status will improve Outcome: Progressing

## 2024-02-09 NOTE — Group Note (Signed)
 Date:  02/09/2024 Time:  8:53 PM  Group Topic/Focus:  Crisis Planning:   The purpose of this group is to help patients create a crisis plan for use upon discharge or in the future, as needed. Self Care:   The focus of this group is to help patients understand the importance of self-care in order to improve or restore emotional, physical, spiritual, interpersonal, and financial health.    Pt did not attend group.  Media Pizzini L 02/09/2024, 8:53 PM

## 2024-02-09 NOTE — Group Note (Signed)
 Date:  02/09/2024 Time:  7:27 PM  Group Topic/Focus: 5 love languages Healthy Communication:   The focus of this group is to discuss communication, barriers to communication, as well as healthy ways to communicate with others.    Participation Level:  Did Not Attend   Chad Greene 02/09/2024, 7:27 PM

## 2024-02-09 NOTE — [facility] Group Notes (Signed)
 [facility] Assessment Progress Note    Pt attended group

## 2024-02-10 ENCOUNTER — Encounter (HOSPITAL_COMMUNITY): Payer: Self-pay

## 2024-02-10 MED ORDER — GABAPENTIN 100 MG PO CAPS
200.0000 mg | ORAL_CAPSULE | Freq: Three times a day (TID) | ORAL | Status: DC
  Administered 2024-02-10 – 2024-02-14 (×10): 200 mg via ORAL
  Filled 2024-02-10 (×7): qty 2
  Filled 2024-02-10: qty 42
  Filled 2024-02-10 (×4): qty 2

## 2024-02-10 MED ORDER — FLUOXETINE HCL 20 MG PO CAPS
20.0000 mg | ORAL_CAPSULE | Freq: Every day | ORAL | Status: DC
  Administered 2024-02-11 – 2024-02-14 (×4): 20 mg via ORAL
  Filled 2024-02-10 (×2): qty 1
  Filled 2024-02-10: qty 7
  Filled 2024-02-10 (×2): qty 1

## 2024-02-10 MED ORDER — OLANZAPINE 10 MG PO TABS
10.0000 mg | ORAL_TABLET | Freq: Every day | ORAL | Status: DC
  Administered 2024-02-11: 10 mg via ORAL
  Filled 2024-02-10: qty 1

## 2024-02-10 MED ORDER — GUAIFENESIN 100 MG/5ML PO LIQD
5.0000 mL | Freq: Four times a day (QID) | ORAL | Status: AC | PRN
  Administered 2024-02-10 – 2024-02-11 (×3): 5 mL via ORAL
  Filled 2024-02-10: qty 10
  Filled 2024-02-10: qty 5
  Filled 2024-02-10: qty 10
  Filled 2024-02-10: qty 5

## 2024-02-10 NOTE — Progress Notes (Signed)
   02/10/24 1300  Psych Admission Type (Psych Patients Only)  Admission Status Voluntary  Psychosocial Assessment  Patient Complaints Apathy;Depression;Suspiciousness  Eye Contact Fair  Facial Expression Flat  Affect Depressed  Speech Soft  Interaction Guarded  Motor Activity Slow  Appearance/Hygiene Unremarkable  Behavior Characteristics Cooperative  Mood Depressed;Anxious  Thought Process  Coherency WDL  Content Paranoia  Delusions Paranoid  Perception WDL  Hallucination None reported or observed  Judgment Impaired  Confusion None  Danger to Self  Current suicidal ideation? Denies  Description of Suicide Plan No Plan  Agreement Not to Harm Self Yes  Description of Agreement Verbal  Danger to Others  Danger to Others None reported or observed

## 2024-02-10 NOTE — BH IP Treatment Plan (Signed)
 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) Active Problems:   Methamphetamine-induced psychotic disorder (HCC)   Generalized anxiety disorder   Current Medications:  Current Facility-Administered Medications  Medication Dose Route Frequency Provider Last Rate Last Admin   acetaminophen  (TYLENOL ) tablet 650 mg  650 mg Oral Q6H PRN Mannie Jerel PARAS, NP       alum & mag hydroxide-simeth (MAALOX/MYLANTA) 200-200-20 MG/5ML suspension 30 mL  30 mL Oral Q4H PRN Mannie Jerel PARAS, NP       haloperidol (HALDOL) tablet 5 mg  5 mg Oral TID PRN Mannie Jerel PARAS, NP       And   diphenhydrAMINE (BENADRYL) capsule 50 mg  50 mg Oral TID PRN Mannie Jerel PARAS, NP       haloperidol lactate (HALDOL) injection 10 mg  10 mg Intramuscular TID PRN Stevens, Terry J, NP       And   diphenhydrAMINE (BENADRYL) injection 50 mg  50 mg Intramuscular TID PRN Mannie Jerel PARAS, NP       And   LORazepam (ATIVAN) injection 2 mg  2 mg Intramuscular TID PRN Mannie Jerel PARAS, NP       haloperidol lactate (HALDOL) injection 5 mg  5 mg Intramuscular TID PRN Mannie Jerel PARAS, NP       And   diphenhydrAMINE (BENADRYL) injection 50 mg  50 mg Intramuscular TID PRN Mannie Jerel PARAS, NP       And   LORazepam (ATIVAN) injection 2 mg  2 mg Intramuscular TID PRN Mannie Jerel PARAS, NP       FLUoxetine (PROZAC) capsule 10 mg  10 mg Oral Daily Nwoko, Agnes I, NP   10 mg at 02/10/24 9177   gabapentin (NEURONTIN) capsule 200 mg  200 mg Oral TID AC & HS Nwoko, Mac I, NP   200 mg at 02/10/24 1157   guaiFENesin (MUCINEX) 12 hr tablet 600 mg  600 mg Oral BID Nwoko, Agnes I, NP   600 mg at 02/10/24 9177   magnesium hydroxide  (MILK OF MAGNESIA) suspension 30 mL  30 mL Oral Daily PRN Mannie Jerel PARAS, NP       nicotine (NICODERM CQ - dosed in mg/24 hours) patch 21 mg  21 mg Transdermal Q0600 Mannie Jerel PARAS, NP       OLANZapine (ZYPREXA) tablet 7.5 mg  7.5 mg Oral Daily Nwoko, Mac I, NP   7.5 mg at 02/10/24 9177   traZODone (DESYREL) tablet 100 mg  100 mg Oral QHS Nwoko, Agnes I, NP   100 mg at 02/09/24 2059   PTA Medications: Medications Prior to Admission  Medication Sig Dispense Refill Last Dose/Taking   Multiple Vitamin (MULTIVITAMIN WITH MINERALS) TABS tablet Take 1 tablet by mouth daily.   Past Week    Patient Stressors: Financial difficulties   Substance abuse    Patient Strengths: Ability for insight  Communication skills   Treatment Modalities: Medication Management, Group therapy, Case management,  1 to 1 session with clinician, Psychoeducation, Recreational therapy.   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) Long Term Goal(s): Improvement in symptoms so as ready for discharge   Short Term Goals: Ability to identify and develop effective coping behaviors will  improve Ability to maintain clinical measurements within normal limits will improve Compliance with prescribed medications will improve Ability to identify triggers associated with substance abuse/mental health issues will improve Ability to identify changes in lifestyle to reduce recurrence of condition will improve Ability to verbalize feelings will improve Ability to disclose and discuss suicidal ideas Ability to demonstrate self-control will improve  Medication Management: Evaluate patient's response, side effects, and tolerance of medication regimen.  Therapeutic Interventions: 1 to 1 sessions, Unit Group sessions and Medication administration.  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) Active Problems:   Methamphetamine-induced psychotic disorder (HCC)   Generalized anxiety disorder  Long Term Goal(s): Improvement in symptoms so as ready for discharge   Short Term Goals: Ability to identify and develop effective coping behaviors will improve Ability to maintain clinical measurements within normal limits will improve Compliance with prescribed medications will improve Ability to identify triggers associated with substance abuse/mental health issues will improve Ability to identify changes in lifestyle to reduce recurrence of condition will improve Ability to verbalize feelings will improve Ability to disclose and discuss suicidal ideas Ability to demonstrate self-control will improve     Medication Management: Evaluate patient's response, side effects, and tolerance of medication regimen.  Therapeutic Interventions: 1 to 1 sessions, Unit Group sessions and Medication administration.  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) Long Term Goal(s): Knowledge of disease and therapeutic regimen to maintain health will improve  Short Term Goals: Ability to remain free from injury will improve, Ability to verbalize frustration and anger appropriately will improve, Ability to demonstrate self-control, Ability to participate in decision making will improve, Ability to verbalize feelings will improve, Ability to disclose and discuss suicidal ideas, Ability to identify and develop effective coping behaviors will improve, and Compliance with prescribed medications will improve  Medication Management: RN will administer medications as ordered by provider, will assess and evaluate patient's response and provide education to patient for prescribed  medication. RN will report any adverse and/or side effects to prescribing provider.  Therapeutic Interventions: 1 on 1 counseling sessions, Psychoeducation, Medication administration, Evaluate responses to treatment, Monitor vital signs and CBGs as ordered, Perform/monitor CIWA, COWS, AIMS and Fall Risk screenings as ordered, Perform wound care treatments as ordered.  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  Short Term Goals: Engage patient in aftercare planning with referrals and resources, Increase social support, Increase ability to appropriately verbalize feelings, Increase emotional regulation, Facilitate acceptance of mental health diagnosis and concerns, Facilitate patient progression through stages of change regarding substance use diagnoses and concerns, Identify triggers associated with mental health/substance abuse issues, and Increase skills for wellness and recovery  Therapeutic Interventions: Assess for all discharge needs, 1 to 1 time with Social worker, Explore available resources and support systems, Assess for adequacy in community support network, Educate family and significant other(s) on suicide prevention, Complete Psychosocial Assessment, Interpersonal group therapy.  Evaluation of Outcomes: Not Progressing   Progress in Treatment: Attending groups: No. Participating in groups: No. Taking medication as prescribed: Yes. Toleration medication: Yes. Family/Significant other contact made: No, will  contact:  [name] (employer) Patient understands diagnosis: Yes. Discussing patient identified problems/goals with staff: Yes. Medical problems stabilized or resolved: Yes. Denies  suicidal/homicidal ideation: Yes. Issues/concerns per patient self-inventory: No.  New problem(s) identified: No, Describe:  none  New Short Term/Long Term Goal(s): detox, medication management for mood stabilization; elimination of SI thoughts; development of comprehensive mental wellness/sobriety plan   Patient Goals:  Go to substance use treatment  Discharge Plan or Barriers: Patient recently admitted. CSW will continue to follow and assess for appropriate referrals and possible discharge planning.    Reason for Continuation of Hospitalization: Medication stabilization Withdrawal symptoms Other; describe paranoia  Estimated Length of Stay: 5-7 days  Last 3 [place] Suicide Severity Risk Score: Flowsheet Row [facility] (Current[facility] Most recent reading at 02/07/2024  9:48 PM ED from 02/07/2024 in [facility] Emergency Department at [facility] Most recent reading at 02/07/2024 10:59 AM UC from 01/01/2024 in [facility]) Most recent reading at 01/01/2024  2:17 PM  C-SSRS RISK CATEGORY No Risk No Risk No Risk    Last PHQ 2/9 Scores:     No data to display          Scribe for Treatment Team: [name], ISRAEL 02/10/2024 12:43 PM

## 2024-02-10 NOTE — Progress Notes (Signed)
 Chad Greene   Type of Note: Substance Use Treatment  Spoke with patient earlier today. Pt given list of substance use programs and was encouraged to begin calling if interested in long term programs. Pt agreeable.  [facility] residential referral was faxed today as well with pt's consent.  Will continue to assist as needed.  Signed:  Bryella Diviney, LCSW-A 02/10/2024  3:18 PM

## 2024-02-10 NOTE — Group Note (Signed)
 Date:  02/10/2024 Time:  3:23 PM  Group Topic/Focus: [facility] Spirituality:   The focus of this group is to discuss how one's spirituality can aide in recovery.    Participation Level:  Did Not Attend  Chad Greene 02/10/2024, 3:23 PM

## 2024-02-10 NOTE — Progress Notes (Signed)
 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 Patient is admitted to the [facility] from the [facility] hospital with complaints of drug induced paranoia & auditory hallucinations telling him to hurt other people.  Admission lab results reveal UDS  + THC and methamphetamine.    24-hour Chart Review: Chart reviewed. Patient's case discussed in interdisciplinary team meeting.  Vital signs reviewed without critical value. No as needed psychotropic medication required overnight.  He slept a documented 11.5 hours.  He is adherent to taking psychotropic medication regimen. Nursing notes indicate patient with nonproductive cough.   Subjective: The patient reports his mood as "it's OK, I guess." He describes difficulty staying asleep due to racing thoughts. Appetite is reported as adequate. He denies suicidal ideation, intent, or plan, and denies homicidal ideation. He endorses auditory hallucinations, including whispering voices and hearing someone tell another person to "get him," noting these symptoms began more than a week ago. He acknowledges ongoing paranoia. He denies medication side effects, although he appears very drowsy during the encounter. He expresses interest in inpatient substance use treatment and reports no pending legal issues. He endorses mild cravings but denies current withdrawal symptoms. He rates his anxiety as 8/10 and his depression as 8/10.  He reports cold symptoms including runny nose and a nonproductive cough but  denies sore throat. Medication changes were discussed, including increasing Prozac to 20 mg and increasing olanzapine to 10 mg, rescheduled for bedtime due to excessive daytime drowsiness. Robitussin PRN will be added for cough.  Total Time spent with patient: 45 minutes  Past Psychiatric History: See H&P.  Past Medical History:  Past Medical History:  Diagnosis Date   Lumbar vertebral fracture (HCC)    History reviewed. No pertinent surgical history. Family History: History reviewed. No pertinent family history.  Family Psychiatric  History: See H&P.  Social History:  Social History   Substance and Sexual Activity  Alcohol Use Yes     Social History   Substance and Sexual Activity  Drug Use Yes   Types: Amphetamines   Comment: Last 3-4 years    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   Vaping status: Every Day   Substances: Nicotine, Flavoring  Substance and Sexual Activity   Alcohol use: Yes   Drug use: Yes    Types: Amphetamines    Comment: Last 3-4 years   Sexual activity: Yes    Birth control/protection: Condom  Other Topics Concern   Not on file  Social History Narrative   Homeless   Social Drivers of Health   Financial Resource Strain: Not on file  Food Insecurity: No Food Insecurity (02/07/2024)   Hunger Vital Sign    Worried About Running Out of Food in the  Last Year: Never true    Ran Out of Food in the Last Year: Never true  Transportation Needs: Unmet Transportation Needs (02/07/2024)   PRAPARE - Administrator, Civil Service (Medical): Yes    Lack of Transportation (Non-Medical): Yes  Physical Activity: Not on file  Stress: Not on  file  Social Connections: Not on file   Additional Social History:   Sleep: Fair Estimated Sleeping Duration (Last 24 Hours): 8.75-10.50 hours  Appetite:  Good  Current Medications: Current Facility-Administered Medications  Medication Dose Route Frequency Provider Last Rate Last Admin   acetaminophen  (TYLENOL ) tablet 650 mg  650 mg Oral Q6H PRN Mannie Jerel PARAS, NP       alum & mag hydroxide-simeth (MAALOX/MYLANTA) 200-200-20 MG/5ML suspension 30 mL  30 mL Oral Q4H PRN Mannie Jerel PARAS, NP       haloperidol (HALDOL) tablet 5 mg  5 mg Oral TID PRN Mannie Jerel PARAS, NP       And   diphenhydrAMINE (BENADRYL) capsule 50 mg  50 mg Oral TID PRN Mannie Jerel PARAS, NP       haloperidol lactate (HALDOL) injection 10 mg  10 mg Intramuscular TID PRN Stevens, Terry J, NP       And   diphenhydrAMINE (BENADRYL) injection 50 mg  50 mg Intramuscular TID PRN Stevens, Terry J, NP       And   LORazepam (ATIVAN) injection 2 mg  2 mg Intramuscular TID PRN Mannie Jerel PARAS, NP       haloperidol lactate (HALDOL) injection 5 mg  5 mg Intramuscular TID PRN Mannie Jerel PARAS, NP       And   diphenhydrAMINE (BENADRYL) injection 50 mg  50 mg Intramuscular TID PRN Mannie Jerel PARAS, NP       And   LORazepam (ATIVAN) injection 2 mg  2 mg Intramuscular TID PRN Mannie Jerel PARAS, NP       NOREEN ON 02/11/2024] FLUoxetine (PROZAC) capsule 20 mg  20 mg Oral Daily Rasheka Denard H, NP       gabapentin (NEURONTIN) capsule 200 mg  200 mg Oral TID Ladasia Sircy H, NP       guaiFENesin (MUCINEX) 12 hr tablet 600 mg  600 mg Oral BID Nwoko, Agnes I, NP   600 mg at 02/10/24 9177   guaiFENesin (ROBITUSSIN) 100 MG/5ML liquid 5 mL  5 mL Oral Q6H PRN Laqueena Hinchey H, NP       magnesium hydroxide (MILK OF MAGNESIA) suspension 30 mL  30 mL Oral Daily PRN Mannie Jerel PARAS, NP       nicotine (NICODERM CQ - dosed in mg/24 hours) patch 21 mg  21 mg Transdermal Q0600 Mannie Jerel PARAS, NP       [START ON 02/11/2024] OLANZapine  (ZYPREXA) tablet 10 mg  10 mg Oral QHS Senan Urey H, NP       traZODone (DESYREL) tablet 100 mg  100 mg Oral QHS Nwoko, Mac I, NP   100 mg at 02/09/24 2059   Lab Results:  No results found for this or any previous visit (from the past 48 hours).  Blood Alcohol level:  Lab Results  Component Value Date   [facility] <15 02/07/2024   ETH <10 11/10/2019   Metabolic Disorder Labs: Lab Results  Component Value Date   HGBA1C 5.7 (H) 02/08/2024   MPG 117 02/08/2024   No results found for: PROLACTIN Lab Results  Component Value Date   CHOL 149 02/08/2024  TRIG 76 02/08/2024   HDL 33 (L) 02/08/2024   CHOLHDL 4.6 02/08/2024   VLDL 15 02/08/2024   LDLCALC 101 (H) 02/08/2024   Physical Findings: AIMS:  , 0 ,  ,  ,  ,  ,   CIWA:   N/A COWS:   N/A  Musculoskeletal: Strength & Muscle Tone: within normal limits Gait & Station: normal Patient leans: N/A  Psychiatric Specialty Exam:  Presentation  General Appearance:  Casual; Fairly Groomed (Tattoos to scap/arm areas.)  Eye Contact: Fair  Speech: Clear and Coherent; Normal Rate  Speech Volume: Decreased  Handedness: Right  Mood and Affect  Mood: Dysphoric (It is okay I guess.)  Affect: Flat   Thought Process  Thought Processes: Coherent; Linear; Goal Directed  Descriptions of Associations:Intact  Orientation:Full (Time, Place and Person)  Thought Content:Logical  History of Schizophrenia/Schizoaffective disorder:No data recorded  Duration of Psychotic Symptoms:No data recorded  Hallucinations:Hallucinations: Auditory Description of Auditory Hallucinations: Whispering and somebody telling somebody else to get him.   Ideas of Reference:Paranoia  Suicidal Thoughts:Suicidal Thoughts: No   Homicidal Thoughts:Homicidal Thoughts: No   Sensorium  Memory: Immediate Fair; Recent Fair; Remote Fair  Judgment: Impaired  Insight: Fair  Art Therapist  Concentration: Fair  Attention  Span:No data recorded Recall: [employer] of Knowledge: Fair  Language: Good  Psychomotor Activity  Psychomotor Activity: No data recorded  Assets  Assets: Communication Skills; Desire for Improvement; Financial Resources/Insurance; Housing; Physical Health; Resilience; Social Support  Sleep  Sleep: Sleep: Fair   Physical Exam: Physical Exam Vitals and nursing note reviewed.  HENT:     Mouth/Throat:     Pharynx: Oropharynx is clear.  Cardiovascular:     Rate and Rhythm: Normal rate.  Pulmonary:     Effort: No respiratory distress.  Genitourinary:    Comments: Deferred. Musculoskeletal:        General: Normal range of motion.  Skin:    General: Skin is dry.  Neurological:     Mental Status: He is alert and oriented to person, place, and time.    Review of Systems  Musculoskeletal:  Negative for joint pain.  Endo/Heme/Allergies:        NKDA.  Psychiatric/Behavioral:  Positive for depression, hallucinations and substance abuse. Negative for memory loss and suicidal ideas. The patient is nervous/anxious and has insomnia.   All other systems reviewed and are negative.  Blood pressure 125/81, pulse 76, temperature 97.8 F (36.6 C), temperature source Oral, resp. rate 16, height 6' (1.829 m), weight 119.1 kg, SpO2 98%. Body mass index is 35.61 kg/m.  Treatment Plan Summary: Daily contact with patient to assess and evaluate symptoms and progress in treatment and Medication management.   Update 02/10/24: Patient is observed on the unit in the day room, dressed in casual clothing. He continues to display significant psychiatric symptoms, including auditory hallucinations, paranoia, dysphoric mood, and flat affect. Nursing staff report persistent paranoid behavior and observations of the patient appearing fearful and suspicious while on the unit, including during meals in the cafeteria. Despite denying medication side effects, he presents as notably sedated with  intermittent dozing. He was initially admitted after experiencing drug-induced paranoia and command auditory hallucinations directing him to harm others. He expresses motivation for inpatient substance use treatment, and social work has provided program lists and submitted a referral to [facility] today. Medication adjustments were made to better target mood symptoms and psychosis while reducing daytime sedation. Given ongoing paranoia, hallucinations, and impaired functioning, continued hospitalization is recommended  for further stabilization, medication titration, and monitoring of tolerability.  Principal/active diagnoses.  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    Hx. Cocaine use disorder.  Plan: The risks/benefits/side-effects/alternatives to the medications in use were discussed in detail with the patient and time was given for patient's questions. The patient consents to medication trial.    Psychosis  - Increase Olanzapine to 10 mg oral nightly  Depression - Increase fluoxetine to 20 mg oral daily  - Hydroxyzine 25 mg po tid prn for anxiety  Substance withdrawal syndrome  - Continue Gabapentin 200 mg oral 3 times daily.    Insomnia - Continue Trazodone 100 mg oral nightly.   BH Agitation protocol  - Continue as recommended (see MAR).     Medical:   - Continue Ensure plus 237 mL twice daily in between meals  - Continue Nicoderm CQ 21 mg trans-dermally every 24 hours for nicotine dependence - Continue Mucinex 600 mg oral twice daily x 5 days for cough.  - Start Robitussin 5 mL every 6 hours as needed, cough     Safety and Monitoring: Voluntary admission to inpatient psychiatric unit for safety, stabilization and treatment Daily contact with patient to assess and evaluate symptoms and progress in treatment Patient's case to be discussed in multi-disciplinary team meeting Observation Level  : q15 minute checks Vital signs: q12 hours Precautions: Safety   Discharge Planning: Social work and case management to assist with discharge planning and identification of hospital follow-up needs prior to discharge Estimated LOS: 5-7 days Discharge Concerns: Need to establish a safety plan; Medication compliance and effectiveness Discharge Goals: Return home with outpatient referrals for mental health follow-up including medication management/psychotherapy  Blair Chiquita Hint, NP, pmhnp, fnp-bc. 02/10/2024, 4:20 PM Patient ID: Chad Greene Mania, male   DOB: 26-Jun-1970, 53 y.o.   MRN: [id number]

## 2024-02-10 NOTE — Plan of Care (Signed)
   Problem: Education: Goal: Knowledge of [place] [facility] Education information/materials will improve Outcome: Progressing Goal: Emotional status will improve Outcome: Progressing Goal: Mental status will improve Outcome: Progressing

## 2024-02-10 NOTE — B[facility] Group Notes (Signed)
 Patient did attend Recreational Therapy Group.

## 2024-02-10 NOTE — Group Note (Signed)
 Date:  02/10/2024 Time:  7:12 PM  Group Topic/Focus: Emotional Wellness Emotional Education:   The focus of this group is to discuss what feelings/emotions are, and how they are experienced.    Participation Level:  Minimal  Participation Quality:  Appropriate  Affect:  Appropriate  Cognitive:  Appropriate  Insight: Appropriate  Engagement in Group:  None  Modes of Intervention:  Discussion  Additional Comments:  Patient attended group  Rosaleen BIRCH Aster Eckrich 02/10/2024, 7:12 PM

## 2024-02-10 NOTE — BHH Group Notes (Signed)
 Chad Greene said his goal for today is to stay alive.  Coping skills talking about my situation Favorite part of the day group.

## 2024-02-10 NOTE — Group Note (Signed)
 Recreation Therapy Group Note   Group Topic:Communication  Group Date: 02/10/2024 Start Time: 9062 End Time: 1008 Facilitators: Allayna Erlich-McCall, LRT,CTRS Location: [address] Dayroom   Group Topic: Communication, Team Building, Problem Solving  Goal Area(s) Addresses:  Patient will effectively work with peer towards shared goal.  Patient will identify skills used to make activity successful.  Patient will identify how skills used during activity can be applied to reach post d/c goals.   Behavioral Response: Engaged  Intervention: STEM Activity- Glass Blower/designer  Activity: Tallest [employer]. In teams of 5-6, patients were given 11 craft pipe cleaners. Using the materials provided, patients were instructed to compete again the opposing team(s) to build the tallest free-standing structure from floor level. The activity was timed; difficulty increased by clinical research associate as production designer, theatre/television/film continued.  Systematically resources were removed with additional directions for example, placing one arm behind their back, working in silence, and shape stipulations. LRT facilitated post-activity discussion reviewing team processes and necessary communication skills involved in completion. Patients were encouraged to reflect how the skills utilized, or not utilized, in this activity can be incorporated to positively impact support systems post discharge.  Education: Pharmacist, Community, Scientist, Physiological, Discharge Planning   Education Outcome: Acknowledges education/In group clarification offered/Needs additional education.    Affect/Mood: Appropriate   Participation Level: Engaged   Participation Quality: Independent   Behavior: Appropriate   Speech/Thought Process: Focused   Insight: Good   Judgement: Good   Modes of Intervention: STEM Activity   Patient Response to Interventions:  Engaged   Education Outcome:  In group clarification offered    Clinical  Observations/Individualized Feedback: Pt was bright and engaged during activity. Pt helped group think of a design for their tower and also construct it. Pt was focused and seemed determined to be successful with the activity.      Plan: Continue to engage patient in RT group sessions 2-3x/week.   Abree Romick-McCall, LRT,CTRS 02/10/2024 12:58 PM

## 2024-02-10 NOTE — Plan of Care (Signed)
  Problem: Education: Goal: Mental status will improve Outcome: Not Progressing Goal: Verbalization of understanding the information provided will improve Outcome: Progressing   Problem: Activity: Goal: Interest or engagement in activities will improve Outcome: Not Progressing Goal: Sleeping patterns will improve Outcome: Progressing

## 2024-02-11 MED ORDER — NICOTINE POLACRILEX 2 MG MT GUM
2.0000 mg | CHEWING_GUM | OROMUCOSAL | Status: DC | PRN
  Administered 2024-02-13 (×2): 2 mg via ORAL

## 2024-02-11 NOTE — Progress Notes (Signed)
 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 Patient is admitted to the [facility] from the [facility] hospital with complaints of drug induced paranoia & auditory hallucinations telling him to hurt other people.  Admission lab results reveal UDS  + THC and methamphetamine.    24-hour Chart Review: Chart reviewed. Patient's case discussed in interdisciplinary team meeting.  Vital signs reviewed without critical value. No as needed psychotropic medication required overnight.  He slept a documented 9.0 hours.  He is adherent to taking psychotropic medication regimen. Nursing notes indicate  Pt was reported as crying during group. After bedtime, pt put trash can in front of door. Pt was reminded that we are doing checks. This clinical research associate attempted to talk with him. He answered when I spoke his name, but when ask if he was okay, pt did not answer. When ask if he needed anything, pt would not answer. When ask if he needed to talk about anything, pt would not answer.    Subjective: The patient reports that his mood is "alright," but he rates both his anxiety and depression as 10/10. He identifies his primary stressor as not knowing "who these people are and why they're after me." He denies suicidal or homicidal ideation. He endorses auditory hallucinations, describing voices telling him "to get him now" and whispering coming from the next room, though he reports that when he checks, no one is there. He denies that the voices are  derogatory and denies visual hallucinations. He continues to endorse paranoid ideation, stating, "I don't know why they're trying to get me. I haven't done nothing to nobody," and identifying paranoia as "my whole situation." He denies medication side effects. He reports not eating breakfast because he was either asleep or in the shower. He reports difficulty sleeping due to coughing throughout the night, which also caused a headache. He endorses a nonproductive cough and runny nose but denies sore throat. He was encouraged to request medication from nursing for pain and cough relief. He states he wants substance use treatment "far away where I don't know nobody." He declines the nicotine patch. He does not elaborate on nursing documentation that he cried during group yesterday, stating only that he felt sad and was thinking. He demonstrates minimal eye contact during the interview.  Total Time spent with patient: 45 minutes  Past Psychiatric History: See H&P.  Past Medical History:  Past Medical History:  Diagnosis Date   Lumbar vertebral fracture (HCC)    History reviewed. No pertinent surgical history. Family History: History reviewed. No pertinent family history.  Family Psychiatric  History: See H&P.  Social History:  Social History   Substance and Sexual Activity  Alcohol Use Yes     Social History   Substance and Sexual Activity  Drug Use Yes   Types: Amphetamines   Comment: Last 3-4 years    Social History   Socioeconomic History   Marital status: Single    Spouse name: Not on file   Number of children: Not on file   Years of education: Not on file   Highest education level: Not on  file  Occupational History   Not on file  Tobacco Use   Smoking status: Every Day    Types: Cigarettes   Smokeless tobacco: Former  Building Services Engineer status: Every Day   Substances: Nicotine , Flavoring  Substance and Sexual Activity   Alcohol use: Yes   Drug use: Yes    Types:  Amphetamines    Comment: Last 3-4 years   Sexual activity: Yes    Birth control/protection: Condom  Other Topics Concern   Not on file  Social History Narrative   Homeless   Social Drivers of Health   Financial Resource Strain: Not on file  Food Insecurity: No Food Insecurity (02/07/2024)   Hunger Vital Sign    Worried About Running Out of Food in the Last Year: Never true    Ran Out of Food in the Last Year: Never true  Transportation Needs: Unmet Transportation Needs (02/07/2024)   PRAPARE - Administrator, Civil Service (Medical): Yes    Lack of Transportation (Non-Medical): Yes  Physical Activity: Not on file  Stress: Not on file  Social Connections: Not on file   Additional Social History:   Sleep: Fair Estimated Sleeping Duration (Last 24 Hours): 7.00-9.00 hours  Appetite:  Good  Current Medications: Current Facility-Administered Medications  Medication Dose Route Frequency Provider Last Rate Last Admin   acetaminophen  (TYLENOL ) tablet 650 mg  650 mg Oral Q6H PRN Mannie Jerel PARAS, NP       alum & mag hydroxide-simeth (MAALOX/MYLANTA) 200-200-20 MG/5ML suspension 30 mL  30 mL Oral Q4H PRN Mannie Jerel PARAS, NP       haloperidol  (HALDOL ) tablet 5 mg  5 mg Oral TID PRN Mannie Jerel PARAS, NP       And   diphenhydrAMINE  (BENADRYL ) capsule 50 mg  50 mg Oral TID PRN Mannie Jerel PARAS, NP       haloperidol  lactate (HALDOL ) injection 10 mg  10 mg Intramuscular TID PRN Mannie Jerel PARAS, NP       And   diphenhydrAMINE  (BENADRYL ) injection 50 mg  50 mg Intramuscular TID PRN Mannie Jerel PARAS, NP       And   LORazepam  (ATIVAN ) injection 2 mg  2 mg Intramuscular TID PRN Mannie Jerel PARAS, NP       haloperidol  lactate (HALDOL ) injection 5 mg  5 mg Intramuscular TID PRN Mannie Jerel PARAS, NP       And   diphenhydrAMINE  (BENADRYL ) injection 50 mg  50 mg Intramuscular TID PRN Mannie Jerel PARAS, NP       And   LORazepam  (ATIVAN ) injection 2 mg  2 mg Intramuscular TID PRN  Mannie Jerel PARAS, NP       FLUoxetine  (PROZAC ) capsule 20 mg  20 mg Oral Daily Romen Yutzy H, NP   20 mg at 02/11/24 1021   gabapentin  (NEURONTIN ) capsule 200 mg  200 mg Oral TID Blair, Klyde Banka H, NP   200 mg at 02/11/24 2120   guaiFENesin  (MUCINEX ) 12 hr tablet 600 mg  600 mg Oral BID Collene Gouge I, NP   600 mg at 02/11/24 1810   guaiFENesin  (ROBITUSSIN) 100 MG/5ML liquid 5 mL  5 mL Oral Q6H PRN Dabney Dever H, NP   5 mL at 02/11/24 2122   magnesium  hydroxide (MILK OF MAGNESIA) suspension 30 mL  30 mL Oral Daily PRN Mannie Jerel PARAS, NP       nicotine  polacrilex (NICORETTE ) gum 2 mg  2 mg Oral PRN  Blair, Billie Trager H, NP       OLANZapine (ZYPREXA) tablet 10 mg  10 mg Oral QHS Zamauri Nez H, NP   10 mg at 02/11/24 2120   traZODone (DESYREL) tablet 100 mg  100 mg Oral QHS Collene Gouge I, NP   100 mg at 02/11/24 2120   Lab Results:  No results found for this or any previous visit (from the past 48 hours).  Blood Alcohol level:  Lab Results  Component Value Date   [facility] <15 02/07/2024   ETH <10 11/10/2019   Metabolic Disorder Labs: Lab Results  Component Value Date   HGBA1C 5.7 (H) 02/08/2024   MPG 117 02/08/2024   No results found for: PROLACTIN Lab Results  Component Value Date   CHOL 149 02/08/2024   TRIG 76 02/08/2024   HDL 33 (L) 02/08/2024   CHOLHDL 4.6 02/08/2024   VLDL 15 02/08/2024   LDLCALC 101 (H) 02/08/2024   Physical Findings: AIMS:  , 0 ,  ,  ,  ,  ,   CIWA:   N/A COWS:   N/A  Musculoskeletal: Strength & Muscle Tone: within normal limits Gait & Station: normal Patient leans: N/A  Psychiatric Specialty Exam:  Presentation  General Appearance:  Casual; Fairly Groomed (Tattoos to scap/arm areas.)  Eye Contact: Fair  Speech: Clear and Coherent  Speech Volume: Decreased  Handedness: Right  Mood and Affect  Mood: Dysphoric (Alright.)  Affect: Flat   Thought Process  Thought Processes: Coherent; Goal  Directed  Descriptions of Associations:Intact  Orientation:Full (Time, Place and Person)  Thought Content:Paranoid Ideation; Delusions  History of Schizophrenia/Schizoaffective disorder:No data recorded  Duration of Psychotic Symptoms:No data recorded  Hallucinations:Hallucinations: Auditory Description of Auditory Hallucinations: Whispering and somebody telling somebody else to get him.   Ideas of Reference:Paranoia; Percusatory; Delusions  Suicidal Thoughts:Suicidal Thoughts: No   Homicidal Thoughts:Homicidal Thoughts: No   Sensorium  Memory: Immediate Fair; Recent Fair  Judgment: Fair  Insight: Fair  Art Therapist  Concentration: Fair  Attention Span:Fair  Recall: [employer] of Knowledge: Fair  Language: Good  Psychomotor Activity  Psychomotor Activity: Psychomotor Activity: Normal   Assets  Assets: Communication Skills; Desire for Improvement; Physical Health; Resilience  Sleep  Sleep: Sleep: Fair   Physical Exam: Physical Exam Vitals and nursing note reviewed.  HENT:     Mouth/Throat:     Pharynx: Oropharynx is clear.  Cardiovascular:     Rate and Rhythm: Normal rate.  Pulmonary:     Effort: No respiratory distress.  Genitourinary:    Comments: Deferred. Musculoskeletal:        General: Normal range of motion.  Skin:    General: Skin is dry.  Neurological:     Mental Status: He is alert and oriented to person, place, and time.    Review of Systems  Musculoskeletal:  Negative for joint pain.  Endo/Heme/Allergies:        NKDA.  Psychiatric/Behavioral:  Positive for depression, hallucinations and substance abuse. Negative for memory loss and suicidal ideas. The patient is nervous/anxious and has insomnia.   All other systems reviewed and are negative.  Blood pressure 132/72, pulse 74, temperature 97.8 F (36.6 C), temperature source Oral, resp. rate 16, height 6' (1.829 m), weight 119.1 kg, SpO2 96%. Body mass index  is 35.61 kg/m.  Treatment Plan Summary: Daily contact with patient to assess and evaluate symptoms and progress in treatment and Medication management.   Update 02/11/24: Nursing notes reflect ongoing paranoia and emotional lability,  including crying during group yesterday. Today, the patient continues to exhibit paranoid ideation and auditory hallucinations, though he appears more alert than yesterday following rescheduling of routine Zyprexa to bedtime. He demonstrates significant psychotic symptoms, including persistent paranoia and non-derogatory auditory hallucinations. His mood appears unstable, with high self-reported levels of anxiety and depression. He also reports upper respiratory symptoms, including nonproductive cough and rhinorrhea, though he denies fever, sore throat, or dyspnea. The nicotine patch will be discontinued due to refusal, and Nicorette gum will be provided as needed. Given ongoing psychotic symptoms and functional impact, continued inpatient hospitalization remains necessary for stabilization and monitoring. Current psychiatric treatment plan will be continued at this time.  Principal/active diagnoses.  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    Hx. Cocaine use disorder.  Plan: The risks/benefits/side-effects/alternatives to the medications in use were discussed in detail with the patient and time was given for patient's questions. The patient consents to medication trial.    Psychosis  - Continue Olanzapine 10 mg oral nightly  Depression - Continue fluoxetine 20 mg oral daily  - Hydroxyzine 25 mg po tid prn for anxiety  Substance withdrawal syndrome  - Continue Gabapentin 200 mg oral 3 times daily.    Insomnia - Continue Trazodone 100 mg oral nightly.   BH Agitation protocol  - Continue as recommended (see MAR).     Medical:   - Continue Ensure plus 237 mL twice daily in between  meals  - Discontinue Nicoderm patch due to repeated refusal  - Start Nicorette gum 2 mg as needed, nicotine dependence  - Continue Mucinex 600 mg oral twice daily x 5 days for cough.  - Continue Robitussin 5 mL every 6 hours as needed, cough     Safety and Monitoring: Voluntary admission to inpatient psychiatric unit for safety, stabilization and treatment Daily contact with patient to assess and evaluate symptoms and progress in treatment Patient's case to be discussed in multi-disciplinary team meeting Observation Level : q15 minute checks Vital signs: q12 hours Precautions: Safety   Discharge Planning: Social work and case management to assist with discharge planning and identification of hospital follow-up needs prior to discharge Estimated LOS: 5-7 days Discharge Concerns: Need to establish a safety plan; Medication compliance and effectiveness Discharge Goals: Return home with outpatient referrals for mental health follow-up including medication management/psychotherapy  Blair Chiquita Hint, NP, pmhnp, fnp-bc. 02/11/2024, 10:36 PM Patient ID: Chad Greene, male   DOB: March 26, 1970, 53 y.o.   MRN: [id number] Patient ID: Chad Greene, male   DOB: [birth year], 53 y.o.   MRN: [id number]

## 2024-02-11 NOTE — Group Note (Signed)
 Recreation Therapy Group Note   Group Topic:Animal Assisted Therapy   Group Date: 02/11/2024 Start Time: 9048 End Time: 1030 Facilitators: Anders Hohmann-McCall, LRT,CTRS Location: 300 [address] Dayroom   Animal-Assisted Activity (AAA) Program Checklist/Progress Notes Patient Eligibility Criteria Checklist & Daily Group note for Rec Tx Intervention  AAA/T Program Assumption of Risk Form signed by Patient/ or Parent Legal Guardian Yes  Patient is free of allergies or severe asthma Yes  Patient reports no fear of animals Yes  Patient reports no history of cruelty to animals Yes  Patient understands his/her participation is voluntary Yes  Patient washes hands before animal contact Yes  Patient washes hands after animal contact Yes  Behavioral Response: Engaged   Education: Charity Fundraiser, Appropriate Animal Interaction   Education Outcome: Acknowledges education.    Affect/Mood: Appropriate   Participation Level: Engaged   Participation Quality: Independent   Behavior: Appropriate   Speech/Thought Process: Focused   Insight: Good   Judgement: Good   Modes of Intervention: Teaching Laboratory Technician   Patient Response to Interventions:  Engaged   Education Outcome:  In group clarification offered    Clinical Observations/Individualized Feedback: Patient attended session and interacted appropriately with therapy dog and peers. Patient asked appropriate questions about therapy dog and his training. Patient shared stories about their pets at home with group.     Plan: Continue to engage patient in RT group sessions 2-3x/week.   Chad Greene, LRT,CTRS 02/11/2024 1:25 PM

## 2024-02-11 NOTE — Group Note (Signed)
 LCSW Group Therapy Note   Group Date: 02/11/2024 Start Time: 1100 End Time: 1200   Participation:  did not attend  Type of Therapy:  Group Therapy   Topic:  Healing From Within: Understanding Our Past, Building Our Future    Objective:  To help participants understand the impact of early experiences on mental and physical health, with a focus on Adverse Childhood Experiences (ACEs), and to explore ways to build resilience and healing.  Group Goals: Understand ACEs and Their Impact: Learn how childhood experiences shape mental and physical health. Build Resilience: Develop strategies for overcoming challenges and creating positive change. Promote Healing: Recognize the value of support and the possibility of healing through therapy and self-care.  Summary: In today's session, we discussed how early experiences, especially ACEs, impact mental and physical health. We explored the effects of stress, abuse, and neglect on brain development and well-being. The group focused on resilience, understanding that healing and positive change are possible with support and self-awareness.  Therapeutic Modalities Used: Psychoeducation: Sharing information about ACEs and their effects. Cognitive Behavioral Therapy (CBT): Helping reframe negative thought patterns. Trauma-Informed Therapy: Creating a safe, supportive space for healing.   Chad Greene O Chad Greene, LCSWA 02/11/2024  12:25 PM

## 2024-02-11 NOTE — Progress Notes (Signed)
(  Sleep Hours) -9.0 as of 0530 (Any PRNs that were needed, meds refused, or side effects to meds)- prn robitussin given @ 2042 (Any disturbances and when (visitation, over night)-none (Concerns raised by the patient)- none (SI/HI/AVH)- denies all  Patient attended wrap-up group this last evening. Pt was reported as crying during group. After bedtime, pt put trash can in front of door. Pt was reminded that we are doing checks. This clinical research associate attempted to talk with him. He answered when I spoke his name, but when ask if he was okay, pt did not answer. When ask if he needed anything, pt would not answer. When ask if he needed to talk about anything, pt would not answer.

## 2024-02-11 NOTE — Plan of Care (Signed)
   Problem: Education: Goal: Knowledge of [place] [facility] Education information/materials will improve Outcome: Progressing Goal: Emotional status will improve Outcome: Progressing Goal: Mental status will improve Outcome: Progressing

## 2024-02-11 NOTE — [facility] Group Notes (Signed)
 [facility] Group Notes:  (Nursing/MHT/Case Management/Adjunct)  Date:  02/11/2024  Time:  11:07 PM  Type of Therapy:  Psychoeducational Skills  Participation Level:  Minimal  Participation Quality:  Attentive  Affect:  Flat  Cognitive:  Appropriate  Insight:  Appropriate  Engagement in Group:  Developing/Improving  Modes of Intervention:  Education  Summary of Progress/Problems: patient rated his day as a 5 or 6 out of a possible 10. He explained that he came out of his shell today. His goal for today was to get better which he says he is still addressing.   Topanga Alvelo S 02/11/2024, 11:07 PM

## 2024-02-11 NOTE — Group Note (Signed)
 Date:  02/11/2024 Time:  3:39 PM  Group Topic/Focus: Social work Patient explaining the meaning of trauma and how to prevent it or when it comes to those circumstances Managing Feelings:   The focus of this group is to identify what feelings patients have difficulty handling and develop a plan to handle them in a healthier way upon discharge. Overcoming Stress:   The focus of this group is to define stress and help patients assess their triggers.    Participation Level:  Did Not Attend   Dolores CHRISTELLA Fredericks 02/11/2024, 3:39 PM

## 2024-02-11 NOTE — Group Note (Signed)
 Date:  02/11/2024 Time:  7:44 PM  Group Topic/Focus:  Goals Group:   The focus of this group is to help patients establish daily goals to achieve during treatment and discuss how the patient can incorporate goal setting into their daily lives to aide in recovery.   Participation Level:  Active  Participation Quality:  Appropriate  Affect:  Appropriate  Cognitive:  Appropriate  Insight: Appropriate  Engagement in Group:  Engaged  Modes of Intervention:  Discussion and Education  Additional Comments:    Chad Greene 02/11/2024, 7:44 PM

## 2024-02-11 NOTE — Progress Notes (Signed)
   02/11/24 1500  Psych Admission Type (Psych Patients Only)  Admission Status Voluntary  Psychosocial Assessment  Patient Complaints Crying spells;Depression  Eye Contact Fair  Facial Expression Flat  Affect Depressed  Speech Soft  Interaction Guarded;Minimal  Motor Activity Slow  Appearance/Hygiene Unremarkable  Behavior Characteristics Cooperative  Mood Depressed  Thought Process  Coherency WDL  Content Paranoia  Delusions Paranoid  Perception WDL  Hallucination None reported or observed  Judgment Impaired  Confusion None  Danger to Self  Current suicidal ideation? Denies  Description of Suicide Plan No Plan  Agreement Not to Harm Self Yes  Description of Agreement Verbal  Danger to Others  Danger to Others None reported or observed

## 2024-02-11 NOTE — Group Note (Signed)
 Date:  02/11/2024 Time:  4:28 PM  Group Topic/Focus:[facility] Coping With Mental Health Crisis:   The purpose of this group is to help patients identify strategies for coping with mental health crisis.  Group discusses possible causes of crisis and ways to manage them effectively.    Participation Level:  Did Not Attend   Dolores CHRISTELLA Fredericks 02/11/2024, 4:28 PM

## 2024-02-11 NOTE — Progress Notes (Signed)
 Chad Greene   Type of Note: [facility]   Received call from [facility] in admissions, states pt is unable to receive treatment with them until mental health is stabilized.   Of note, pt would like to receive substance use treatment far away from [place]. Will discuss with patient again before sending additional notes to facility when mental health symptoms improve.   Signed:  Cohan Stipes, LCSW-A 02/11/2024  2:47 PM

## 2024-02-11 NOTE — Group Note (Signed)
 Date:  02/11/2024 Time:  11:19 AM  Group Topic/Focus: Pet therapy  Overcoming Stress:   The focus of this group is to define stress and help patients assess their triggers.    Participation Level:  Did Not Attend   Dolores CHRISTELLA Fredericks 02/11/2024, 11:19 AM

## 2024-02-11 NOTE — Plan of Care (Signed)
   Problem: Education: Goal: Knowledge of Veteran General Education information/materials will improve Outcome: Progressing Goal: Emotional status will improve Outcome: Progressing Goal: Verbalization of understanding the information provided will improve Outcome: Progressing

## 2024-02-12 MED ORDER — OLANZAPINE 7.5 MG PO TABS
15.0000 mg | ORAL_TABLET | Freq: Every day | ORAL | Status: DC
  Administered 2024-02-12 – 2024-02-13 (×2): 15 mg via ORAL
  Filled 2024-02-12: qty 14
  Filled 2024-02-12: qty 6
  Filled 2024-02-12: qty 2
  Filled 2024-02-12: qty 6

## 2024-02-12 NOTE — Progress Notes (Signed)
(  Sleep Hours) -10.0 as of 0530 (Any PRNs that were needed, meds refused, or side effects to meds)- prn robitussin for cough (Any disturbances and when (visitation, over night)-none (Concerns raised by the patient)- none (SI/HI/AVH)- Endorses hallucinations  Pt denies SI/HI but does endorse hallucinations. Pt will not talk about them but states they are improving.

## 2024-02-12 NOTE — Group Note (Signed)
 Recreation Therapy Group Note   Group Topic:Team Building  Group Date: 02/12/2024 Start Time: 0930 End Time: 1002 Facilitators: Laythan Hayter-McCall, LRT,CTRS Location: [address] Dayroom   Group Topic: Communication, Team Building, Problem Solving  Goal Area(s) Addresses:  Patient will effectively work with peer towards shared goal.  Patient will identify skills used to make activity successful.  Patient will identify how skills used during activity can be used to reach post d/c goals.   Behavioral Response: Tired  Intervention: STEM Activity  Activity: Straw Bridge. In teams of 3-5, patients were given 15 plastic drinking straws and an equal length of masking tape. Using the materials provided, patients were instructed to build a free standing bridge-like structure to suspend an everyday item (ex: puzzle box) off of the floor or table surface. All materials were required to be used by the team in their design. LRT facilitated post-activity discussion reviewing team process. Patients were encouraged to reflect how the skills used in this activity can be generalized to daily life post discharge.   Education: Pharmacist, Community, Scientist, Physiological, Discharge Planning   Education Outcome: Acknowledges education/In group clarification offered/Needs additional education.    Affect/Mood: Flat   Participation Level: None   Participation Quality: None   Behavior: Sleepy   Speech/Thought Process: None   Insight: None   Judgement: None   Modes of Intervention: STEM Activity   Patient Response to Interventions:  Disengaged   Education Outcome:  In group clarification offered    Clinical Observations/Individualized Feedback: Pt came half-way through group. Pt was quiet and disengaged. Pt sat under television and fell asleep.    Plan: Continue to engage patient in RT group sessions 2-3x/week.   Ryun Velez-McCall, LRT,CTRS 02/12/2024 1:05 PM

## 2024-02-12 NOTE — Progress Notes (Signed)
 [facility]enn[facility]   Type of Note: Substance use treatment   Spoke with patient this morning, reports he has not made any phone calls to treatment facilities. Encouraged pt to make calls today to complete phone screens, pt agreeable.  Pt requesting social work marketing executive and advise he is at [facility]. Phone number and name provided Abigail Finder- [phone]). Written consent provided upon admission.   Signed:  Mikea Quadros, LCSW-A 02/12/2024  9:47 AM

## 2024-02-12 NOTE — Progress Notes (Cosign Needed Addendum)
 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 Patient is admitted to the [facility] from the [facility] hospital with complaints of drug induced paranoia & auditory hallucinations telling him to hurt other people.  Admission lab results reveal UDS  + THC and methamphetamine.    24-hour Chart Review: Chart reviewed. Patient's case discussed in interdisciplinary team meeting.  Vital signs reviewed without critical value. No as needed psychotropic medication required overnight.  He slept a documented 10.0 hours.  He is adherent to taking psychotropic medication regimen. Nursing notes indicate Pt denies SI/HI but does endorse hallucinations. Pt will not talk about them but states they are improving.  Subjective: Chad Greene reports that his mood is "alright" today. He notes some difficulty staying asleep last night. He states that his paranoia remains the same and continues to experience auditory hallucinations of voices telling him to "watch me" and "come get me", though he feels they have improved slightly. He denies visual hallucinations, suicidal ideation, homicidal ideation, and medication side effects. He reports feeling sweaty, which he attributes to withdrawal symptoms, and notes mild cravings over the past one to two days. He states that his cough has improved, denies sore throat, and reports minimal nasal drainage. He acknowledges that he has not contacted any substance-use treatment centers and expresses reluctance to do so because  he does not want anyone knowing his location. He states he would prefer placement far away, in another county or state, because he does not want "these people" to follow him. He reports that his boss can provide transportation and that he struggles with phone communication and would need assistance making calls. He says he was recently at a rehab facility in [place] the day after Thanksgiving but became concerned that people were following him, though he is unsure who they were.  Total Time spent with patient: 45 minutes  Past Psychiatric History: See H&P.  Past Medical History:  Past Medical History:  Diagnosis Date   Lumbar vertebral fracture (HCC)    History reviewed. No pertinent surgical history. Family History: History reviewed. No pertinent family history.  Family Psychiatric  History: See H&P.  Social History:  Social History   Substance and Sexual Activity  Alcohol Use Yes     Social History   Substance and Sexual Activity  Drug Use Yes   Types: Amphetamines   Comment: Last 3-4 years    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   Vaping status: Every Day   Substances: Nicotine, Flavoring  Substance and Sexual Activity   Alcohol use: Yes   Drug use: Yes    Types: Amphetamines    Comment: Last 3-4 years   Sexual activity: Yes    Birth control/protection: Condom  Other  Topics Concern   Not on file  Social History Narrative   Homeless   Social Drivers of Health   Financial Resource Strain: Not on file  Food Insecurity: No Food Insecurity (02/07/2024)   Hunger Vital Sign    Worried About Running Out of Food in the Last Year: Never true    Ran Out of Food in  the Last Year: Never true  Transportation Needs: Unmet Transportation Needs (02/07/2024)   PRAPARE - Administrator, Civil Service (Medical): Yes    Lack of Transportation (Non-Medical): Yes  Physical Activity: Not on file  Stress: Not on file  Social Connections: Not on file   Additional Social History:   Sleep: Fair Estimated Sleeping Duration (Last 24 Hours): 7.75-8.75 hours  Appetite:  Good  Current Medications: Current Facility-Administered Medications  Medication Dose Route Frequency Provider Last Rate Last Admin   acetaminophen  (TYLENOL ) tablet 650 mg  650 mg Oral Q6H PRN Mannie Jerel PARAS, NP       alum & mag hydroxide-simeth (MAALOX/MYLANTA) 200-200-20 MG/5ML suspension 30 mL  30 mL Oral Q4H PRN Mannie Jerel PARAS, NP       haloperidol  (HALDOL ) tablet 5 mg  5 mg Oral TID PRN Mannie Jerel PARAS, NP       And   diphenhydrAMINE  (BENADRYL ) capsule 50 mg  50 mg Oral TID PRN Mannie Jerel PARAS, NP       haloperidol  lactate (HALDOL ) injection 10 mg  10 mg Intramuscular TID PRN Mannie Jerel PARAS, NP       And   diphenhydrAMINE  (BENADRYL ) injection 50 mg  50 mg Intramuscular TID PRN Mannie Jerel PARAS, NP       And   LORazepam  (ATIVAN ) injection 2 mg  2 mg Intramuscular TID PRN Mannie Jerel PARAS, NP       haloperidol  lactate (HALDOL ) injection 5 mg  5 mg Intramuscular TID PRN Mannie Jerel PARAS, NP       And   diphenhydrAMINE  (BENADRYL ) injection 50 mg  50 mg Intramuscular TID PRN Mannie Jerel PARAS, NP       And   LORazepam  (ATIVAN ) injection 2 mg  2 mg Intramuscular TID PRN Mannie Jerel PARAS, NP       FLUoxetine  (PROZAC ) capsule 20 mg  20 mg Oral Daily Avondre Richens H, NP   20 mg at 02/12/24 9147   gabapentin  (NEURONTIN ) capsule 200 mg  200 mg Oral TID Blair, Misael Mcgaha H, NP   200 mg at 02/12/24 1304   guaiFENesin  (MUCINEX ) 12 hr tablet 600 mg  600 mg Oral BID Collene Gouge I, NP   600 mg at 02/12/24 9147   magnesium  hydroxide (MILK OF MAGNESIA) suspension 30 mL  30 mL Oral  Daily PRN Mannie Jerel PARAS, NP       nicotine  polacrilex (NICORETTE ) gum 2 mg  2 mg Oral PRN Dawanda Mapel H, NP       OLANZapine  (ZYPREXA ) tablet 15 mg  15 mg Oral QHS Contina Strain H, NP       traZODone  (DESYREL ) tablet 100 mg  100 mg Oral QHS Nwoko, Gouge I, NP   100 mg at 02/11/24 2120   Lab Results:  No results found for this or any previous visit (from the past 48 hours).  Blood Alcohol level:  Lab Results  Component Value Date   [facility] <15 02/07/2024   ETH <10 11/10/2019   Metabolic Disorder Labs: Lab Results  Component Value Date   HGBA1C 5.7 (H) 02/08/2024  MPG 117 02/08/2024   No results found for: PROLACTIN Lab Results  Component Value Date   CHOL 149 02/08/2024   TRIG 76 02/08/2024   HDL 33 (L) 02/08/2024   CHOLHDL 4.6 02/08/2024   VLDL 15 02/08/2024   LDLCALC 101 (H) 02/08/2024   Physical Findings: AIMS:  , 0 ,  ,  ,  ,  ,   CIWA:   N/A COWS:   N/A  Musculoskeletal: Strength & Muscle Tone: within normal limits Gait & Station: normal Patient leans: N/A  Psychiatric Specialty Exam:  Presentation  General Appearance:  Casual  Eye Contact: Fair  Speech: Clear and Coherent  Speech Volume: Increased  Handedness: Right  Mood and Affect  Mood: -- (Alright.)  Affect: Flat   Thought Process  Thought Processes: Coherent; Goal Directed  Descriptions of Associations:Intact  Orientation:Full (Time, Place and Person)  Thought Content:Paranoid Ideation; Delusions  History of Schizophrenia/Schizoaffective disorder:No data recorded  Duration of Psychotic Symptoms:No data recorded  Hallucinations:Hallucinations: Auditory Description of Auditory Hallucinations: Tell them to watch me and come immediately.   Ideas of Reference:Paranoia; Percusatory; Delusions  Suicidal Thoughts:Suicidal Thoughts: No   Homicidal Thoughts:Homicidal Thoughts: No   Sensorium  Memory: Immediate Fair; Recent  Fair  Judgment: Fair  Insight: Fair  Art Therapist  Concentration: Fair  Attention Span:Fair  Recall: [employer] of Knowledge: Fair  Language: Good  Psychomotor Activity  Psychomotor Activity: Psychomotor Activity: Normal   Assets  Assets: Communication Skills; Desire for Improvement; Physical Health; Resilience  Sleep  Sleep: Sleep: Fair   Physical Exam: Physical Exam Vitals and nursing note reviewed.  HENT:     Mouth/Throat:     Pharynx: Oropharynx is clear.  Cardiovascular:     Rate and Rhythm: Normal rate.  Pulmonary:     Effort: No respiratory distress.  Genitourinary:    Comments: Deferred. Musculoskeletal:        General: Normal range of motion.  Skin:    General: Skin is dry.  Neurological:     Mental Status: He is alert and oriented to person, place, and time.    Review of Systems  Musculoskeletal:  Negative for joint pain.  Endo/Heme/Allergies:        NKDA.  Psychiatric/Behavioral:  Positive for depression, hallucinations and substance abuse. Negative for memory loss and suicidal ideas. The patient is nervous/anxious and has insomnia.   All other systems reviewed and are negative.  Blood pressure 133/87, pulse 79, temperature (!) 97.1 F (36.2 C), temperature source Oral, resp. rate 16, height 6' (1.829 m), weight 119.1 kg, SpO2 97%. Body mass index is 35.61 kg/m.  Treatment Plan Summary: Daily contact with patient to assess and evaluate symptoms and progress in treatment and Medication management.   Update 02/12/24: Chad remains psychiatrically symptomatic with persistent paranoia and chronic auditory hallucinations, though he notes slight improvement. There is no evidence of visual hallucinations or suicidal or homicidal ideation. Withdrawal symptoms appear mild, consisting primarily of sweating and recent cravings. Mood is stable, and physical symptoms related to an upper-respiratory illness appear to be improving. The  patient continues to express persecutory concerns that individuals are following him, which may be contributing to avoidance of contacting treatment facilities. Olanzapine will be increased to 15 mg at bedtime to target sleep disturbance, paranoia, and residual hallucinations. Social work notes indicate he was declined by [facility] residential until his mental health stabilizes, reinforcing the need for continued inpatient care and support with substance-use placement coordination.   02/11/24: Nursing notes reflect  ongoing paranoia and emotional lability, including crying during group yesterday. Today, the patient continues to exhibit paranoid ideation and auditory hallucinations, though he appears more alert than yesterday following rescheduling of routine Zyprexa to bedtime. He demonstrates significant psychotic symptoms, including persistent paranoia and non-derogatory auditory hallucinations. His mood appears unstable, with high self-reported levels of anxiety and depression. He also reports upper respiratory symptoms, including nonproductive cough and rhinorrhea, though he denies fever, sore throat, or dyspnea. The nicotine patch will be discontinued due to refusal, and Nicorette gum will be provided as needed. Given ongoing psychotic symptoms and functional impact, continued inpatient hospitalization remains necessary for stabilization and monitoring. Current psychiatric treatment plan will be continued at this time.  02/10/24: Patient is observed on the unit in the day room, dressed in casual clothing. He continues to display significant psychiatric symptoms, including auditory hallucinations, paranoia, dysphoric mood, and flat affect. Nursing staff report persistent paranoid behavior and observations of the patient appearing fearful and suspicious while on the unit, including during meals in the cafeteria. Despite denying medication side effects, he presents as notably sedated with intermittent dozing. He  was initially admitted after experiencing drug-induced paranoia and command auditory hallucinations directing him to harm others. He expresses motivation for inpatient substance use treatment, and social work has provided program lists and submitted a referral to [facility] today. Medication adjustments were made to better target mood symptoms and psychosis while reducing daytime sedation. Given ongoing paranoia, hallucinations, and impaired functioning, continued hospitalization is recommended for further stabilization, medication titration, and monitoring of tolerability.    Principal/active diagnoses.  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    Hx. Cocaine use disorder.  Plan: The risks/benefits/side-effects/alternatives to the medications in use were discussed in detail with the patient and time was given for patient's questions. The patient consents to medication trial.    Psychosis  - Increase olanzapine to 15 mg oral nightly, paranoia, delusions  Depression - Continue fluoxetine 20 mg oral daily  - Hydroxyzine 25 mg po tid prn for anxiety  Substance withdrawal syndrome  - Continue Gabapentin 200 mg oral 3 times daily.    Insomnia - Continue Trazodone 100 mg oral nightly.   BH Agitation protocol  - Continue as recommended (see MAR).     Medical:   - Continue Ensure plus 237 mL twice daily in between meals  - Discontinue Nicoderm patch due to repeated refusal  - Start Nicorette gum 2 mg as needed, nicotine dependence  - Continue Mucinex 600 mg oral twice daily x 5 days for cough.  - Continue Robitussin 5 mL every 6 hours as needed, cough     Safety and Monitoring: Voluntary admission to inpatient psychiatric unit for safety, stabilization and treatment Daily contact with patient to assess and evaluate symptoms and progress in treatment Patient's case to be discussed in multi-disciplinary  team meeting Observation Level : q15 minute checks Vital signs: q12 hours Precautions: Safety   Discharge Planning: Social work and case management to assist with discharge planning and identification of hospital follow-up needs prior to discharge Estimated LOS: 5-7 days Discharge Concerns: Need to establish a safety plan; Medication compliance and effectiveness Discharge Goals: Return home with outpatient referrals for mental health follow-up including medication management/psychotherapy  Blair Chiquita Hint, NP, pmhnp, fnp-bc. 02/12/2024, 4:00 PM Patient ID: Chad Greene, male   DOB: [birth year], 53 y.o.   MRN: [id number] Patient ID: Chad Greene, male   DOB: November 06, 1970, 53  y.o.   MRN: [id number] Patient ID: Chad Greene, male   DOB: 1970/07/11, 53 y.o.   MRN: [id number]

## 2024-02-12 NOTE — Group Note (Signed)
 Date:  02/12/2024 Time:  4:43 PM  Group Topic/Focus:  Nursing Group     Participation Level:  Active   Chad Greene Chad Greene 02/12/2024, 4:43 PM

## 2024-02-12 NOTE — [facility] Suicide Risk Assessment (Signed)
 [facility] INPATIENT:  Family/Significant Other Suicide Prevention Education  Suicide Prevention Education:  Education Completed; Chad [phone] (employer/friend),  (name of family member/significant other) has been identified by the patient as the family member/significant other with whom the patient will be residing, and identified as the person(s) who will aid the patient in the event of a mental health crisis (suicidal ideations/suicide attempt).  With written consent from the patient, the family member/significant other has been provided the following suicide prevention education, prior to the and/or following the discharge of the patient.  Pt has been living with another friend, reports pt is technically homeless. Chad believes pt got a hold of bad drugs. Has known pt his whole life and had never seen pt like he was prior to Thanksgiving this year (paranoid, hallucinating). 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   States to his knowledge, pt does not have access to weapons or firearms.   The suicide prevention education provided includes the following: Suicide risk factors Suicide prevention and interventions National Suicide Hotline telephone number [facility] assessment telephone number [facility] Emergency Assistance 911 [facility] and/or Residential Mobile Crisis Unit telephone number  Request made of family/significant other to: Remove weapons (e.g., guns, rifles, knives), all items previously/currently identified as safety concern.   Remove drugs/medications (over-the-counter, prescriptions, illicit drugs), all items previously/currently identified as a safety concern.  The family member/significant other verbalizes understanding of the suicide prevention education information provided.  The family member/significant other agrees to remove the items of safety concern listed above.  Chad Greene 02/12/2024, 11:09 AM

## 2024-02-12 NOTE — Group Note (Signed)
 Date:  02/12/2024 Time:  11:18 AM  Group Topic/Focus:  Recreational Therapy    Participation Level:  Active   Chad Greene LITTIE Molly 02/12/2024, 11:18 AM

## 2024-02-12 NOTE — Group Note (Signed)
 Date:  02/12/2024 Time:  12:56 PM  Group Topic/Focus:  Pharmacy group     Participation Level:  Did Not Attend   Chad Greene Molly 02/12/2024, 12:56 PM

## 2024-02-12 NOTE — Group Note (Signed)
 Date:  02/12/2024 Time:  10:02 AM  Group Topic/Focus:  Goals Group:   The focus of this group is to help patients establish daily goals to achieve during treatment and discuss how the patient can incorporate goal setting into their daily lives to aide in recovery.    Participation Level:  Did Not Attend

## 2024-02-12 NOTE — Group Note (Signed)
 Date:  02/12/2024 Time:  5:24 PM  Group Topic/Focus:  Developing a Wellness Toolbox:   The focus of this group is to help patients develop a wellness toolbox with skills and strategies to promote recovery upon discharge. Sleep Hygeine   Participation Level:  Active  Participation Quality:  Attentive  Affect:  Appropriate  Cognitive:  Alert  Insight: Appropriate  Engagement in Group:  Engaged  Modes of Intervention:  Activity    Inocente PARAS Jaleah Lefevre 02/12/2024, 5:24 PM

## 2024-02-12 NOTE — [facility] Group Notes (Signed)
 [facility] Group Notes:  (Nursing/MHT/Case Management/Adjunct)  Date:  02/12/2024  Time:  2000 Type of Therapy:  Narcotics Anonymous Meeting  Participation Level:  Active  Participation Quality:  Appropriate, Attentive, Sharing, and Supportive  Affect:  Appropriate  Cognitive:  Alert  Insight:  Improving  Engagement in Group:  Engaged  Modes of Intervention:  Clarification, Education, and Support  Summary of Progress/Problems:  Chad Greene 02/12/2024, 10:07 PM

## 2024-02-12 NOTE — Progress Notes (Signed)
 Spirituality group facilitated by Elia Rockie Sofia, BCC.  Group Description: Group focused on topic of hope. Patients participated in facilitated discussion around topic, connecting with one another around experiences and definitions for hope. Group members engaged with visual explorer photos, reflecting on what hope looks like for them today. Group engaged in discussion around how their definitions of hope are present today in hospital.  Modalities: Psycho-social ed, Adlerian, Narrative, MI  Patient Progress: Chad Greene attended part of group. His participation level was minimal and it was difficult to assess his engagement.

## 2024-02-12 NOTE — Group Note (Signed)
 Date:  02/12/2024 Time:  3:17 PM  Group Topic/Focus:  Spirituality:   The focus of this group is to discuss how one's spirituality can aide in recovery.    Participation Level:  Active   Chad Greene LITTIE Molly 02/12/2024, 3:17 PM

## 2024-02-12 NOTE — Progress Notes (Signed)
   02/12/24 1200  Psych Admission Type (Psych Patients Only)  Admission Status Voluntary  Psychosocial Assessment  Patient Complaints Worrying  Eye Contact Fair  Facial Expression Animated  Affect Apprehensive  Speech Soft  Interaction Minimal  Motor Activity Slow  Appearance/Hygiene Unremarkable  Behavior Characteristics Cooperative  Mood Depressed  Thought Process  Coherency WDL  Content Paranoia  Delusions Paranoid  Perception Hallucinations  Hallucination Auditory  Judgment Impaired  Confusion None  Danger to Self  Current suicidal ideation? Denies  Description of Suicide Plan No Plan  Agreement Not to Harm Self Yes  Description of Agreement Verbal  Danger to Others  Danger to Others None reported or observed

## 2024-02-13 NOTE — Progress Notes (Signed)
   02/13/24 0919  Psychosocial Assessment  Patient Complaints None  Eye Contact Fair  Facial Expression Flat  Affect Appropriate to circumstance  Speech Soft  Interaction Minimal  Motor Activity Other (Comment) (WDL)  Appearance/Hygiene Unremarkable  Behavior Characteristics Appropriate to situation;Cooperative  Mood Depressed;Preoccupied  Thought Process  Coherency WDL  Content WDL  Delusions None reported or observed  Perception Hallucinations  Hallucination Auditory  Judgment Impaired  Confusion None  Danger to Self  Current suicidal ideation? Denies  Agreement Not to Harm Self Yes  Description of Agreement Verbal  Danger to Others  Danger to Others None reported or observed

## 2024-02-13 NOTE — Group Note (Signed)
 Date:  02/13/2024 Time:  3:41 PM  Group Topic/Focus: Occupational therapy Healthy Communication:   The focus of this group is to discuss communication, barriers to communication, as well as healthy ways to communicate with others.    Participation Level:  Active  Chad Greene 02/13/2024, 3:41 PM

## 2024-02-13 NOTE — Group Note (Signed)
 Occupational Therapy Group Note  Group Topic:Coping Skills  Group Date: 02/13/2024 Start Time: 1500 End Time: 1530 Facilitators: Dot Dallas MATSU, OT   Group Description: Group encouraged increased engagement and participation through discussion and activity focused on Coping Ahead. Patients were split up into teams and selected a card from a stack of positive coping strategies. Patients were instructed to act out/charade the coping skill for other peers to guess and receive points for their team. Discussion followed with a focus on identifying additional positive coping strategies and patients shared how they were going to cope ahead over the weekend while continuing hospitalization stay.  Therapeutic Goal(s): Identify positive vs negative coping strategies. Identify coping skills to be used during hospitalization vs coping skills outside of hospital/at home Increase participation in therapeutic group environment and promote engagement in treatment   Participation Level: Engaged   Participation Quality: Independent   Behavior: Appropriate   Speech/Thought Process: Relevant   Affect/Mood: Appropriate   Insight: Fair   Judgement: Fair      Modes of Intervention: Education  Patient Response to Interventions:  Attentive   Plan: Continue to engage patient in OT groups 2 - 3x/week.  02/13/2024  D[place] MATSU Dot, OT  Chad Greene, OT

## 2024-02-13 NOTE — BHH Group Notes (Signed)
 Salil did not attend wrap up group. Sherwood attend NA group

## 2024-02-13 NOTE — Group Note (Signed)
 Date:  02/13/2024 Time:  10:44 AM  Group Topic/Focus:  Goals Group:   The focus of this group is to help patients establish daily goals to achieve during treatment and discuss how the patient can incorporate goal setting into their daily lives to aide in recovery.    Participation Level:  Did Not Attend  Participation Quality:  Did Not Attend  Affect:  Did Not Attend  Cognitive:  Did Not Attend  Insight: None  Engagement in Group:  Did Not Attend  Modes of Intervention:  Did Not Attend  Additional Comments:  Did Not Attend  Chad Greene 02/13/2024, 10:44 AM

## 2024-02-13 NOTE — BH Assessment (Signed)
(  Sleep Hours) - 9.5 (Any PRNs that were needed, meds refused, or side effects to meds)-  (Any disturbances and when (visitation, over night)- None (Concerns raised by the patient)- None (SI/HI/AVH)- Denies

## 2024-02-13 NOTE — BHH Group Notes (Signed)
 Adult Psychoeducational Group Note  Date:  02/13/2024 Time:  8:52 PM  Group Topic/Focus:  Wrap-Up Group:   The focus of this group is to help patients review their daily goal of treatment and discuss progress on daily workbooks.  Participation Level:  Active  Participation Quality:  Appropriate  Affect:  Appropriate  Cognitive:  Appropriate  Insight: Appropriate  Engagement in Group:  Engaged  Modes of Intervention:  Discussion  Additional Comments:  Chad Greene said his day was a 7. Goal for today open up more. Coping skills being able to explain. Favorite part of day When they pick songs  Chad Greene 02/13/2024, 8:52 PM

## 2024-02-13 NOTE — Plan of Care (Signed)
   Problem: Education: Goal: Knowledge of [place] [facility] Education information/materials will improve Outcome: Progressing Goal: Emotional status will improve Outcome: Progressing Goal: Mental status will improve Outcome: Progressing Goal: Verbalization of understanding the information provided will improve Outcome: Progressing

## 2024-02-13 NOTE — Plan of Care (Signed)
   Problem: Education: Goal: Knowledge of [place] [facility] Education information/materials will improve Outcome: Progressing Goal: Emotional status will improve Outcome: Progressing Goal: Mental status will improve Outcome: Progressing

## 2024-02-13 NOTE — Progress Notes (Signed)
 [facility] MD Progress Note  02/13/2024 4:12 PM Chad Greene  MRN:  [id number]  Reason for admission: 53 year old male with no known history of mental illness, psychiatric hospitalizations or treatments. Patient is admitted to the [facility] from the [facility] hospital with complaints of drug induced paranoia & auditory hallucinations telling him to hurt other people.  Admission lab results reveal UDS  + THC and methamphetamine.    Daily notes: Itzel is seen. Chart reviewed. The chart findings discussed with the treatment team. He presents alert. Oriented & aware of situation. He is visible on the unit, attending group sessions. Patient is showing some improvement in his paranoid ideation. He reports, I'm feeling a lot better. I will still describe my mood as so-so. The major thing I'm having now is hot/cold flashes. It started last night & has been going since I woke up this morning. I feel a bit irritated as a result. Like I told you. My depression is still up & down some times. I'm taking the medicines, no side effects. I slept fairly last night. I still feel from time to time that I'm being watched or followed by some individuals, that feeling is getting better. I will rate my depression #5. I'm not having any anxiety this morning. Traver's symptoms seem to be responding gradually to his treatment regimen. He is encouraged to continue to take his medications & attend group sessions. He currently denies any SIHI or AVH. His delusional thinking & paranoid ideations are currently on the amend. Continue current plan of care as already in progress.  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  Past Medical History:  Past Medical History:  Diagnosis Date   Lumbar vertebral fracture (HCC)    History reviewed. No pertinent surgical history. Family History: History reviewed. No pertinent family history.  Family Psychiatric  History: See H&P.  Social History:  Social History   Substance and Sexual Activity  Alcohol Use Yes     Social History   Substance and Sexual Activity  Drug Use Yes   Types: Amphetamines   Comment: Last 3-4 years    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   Vaping status: Every Day   Substances: Nicotine, Flavoring  Substance and Sexual Activity   Alcohol use: Yes   Drug use: Yes    Types: Amphetamines    Comment: Last 3-4 years   Sexual activity: Yes    Birth control/protection: Condom  Other Topics Concern   Not on file  Social History Narrative   Homeless   Social Drivers of Health   Financial Resource Strain: Not on file  Food Insecurity: No Food Insecurity (02/07/2024)   Hunger Vital Sign    Worried About Running Out of Food in the Last Year: Never true    Ran Out of Food in the Last Year: Never true  Transportation  Needs: Unmet Transportation Needs (02/07/2024)   PRAPARE - Administrator, Civil Service (Medical): Yes    Lack of Transportation (Non-Medical): Yes  Physical Activity: Not on file  Stress: Not on file  Social Connections: Not on file   Additional Social History:   Sleep: Fair Estimated Sleeping Duration (Last 24 Hours): 6.25-7.25 hours  Appetite:  Good  Current Medications: Current Facility-Administered Medications  Medication Dose Route Frequency  Provider Last Rate Last Admin   acetaminophen  (TYLENOL ) tablet 650 mg  650 mg Oral Q6H PRN Mannie Jerel PARAS, NP       alum & mag hydroxide-simeth (MAALOX/MYLANTA) 200-200-20 MG/5ML suspension 30 mL  30 mL Oral Q4H PRN Mannie Jerel PARAS, NP   30 mL at 02/12/24 2122   haloperidol (HALDOL) tablet 5 mg  5 mg Oral TID PRN Mannie Jerel PARAS, NP       And   diphenhydrAMINE (BENADRYL) capsule 50 mg  50 mg Oral TID PRN Mannie Jerel PARAS, NP       haloperidol lactate (HALDOL) injection 10 mg  10 mg Intramuscular TID PRN Mannie Jerel PARAS, NP       And   diphenhydrAMINE (BENADRYL) injection 50 mg  50 mg Intramuscular TID PRN Mannie Jerel PARAS, NP       And   LORazepam (ATIVAN) injection 2 mg  2 mg Intramuscular TID PRN Mannie Jerel PARAS, NP       haloperidol lactate (HALDOL) injection 5 mg  5 mg Intramuscular TID PRN Mannie Jerel PARAS, NP       And   diphenhydrAMINE (BENADRYL) injection 50 mg  50 mg Intramuscular TID PRN Mannie Jerel PARAS, NP       And   LORazepam (ATIVAN) injection 2 mg  2 mg Intramuscular TID PRN Mannie Jerel PARAS, NP       FLUoxetine (PROZAC) capsule 20 mg  20 mg Oral Daily Bennett, Christal H, NP   20 mg at 02/13/24 0919   gabapentin (NEURONTIN) capsule 200 mg  200 mg Oral TID Blair, Christal H, NP   200 mg at 02/13/24 0919   magnesium hydroxide (MILK OF MAGNESIA) suspension 30 mL  30 mL Oral Daily PRN Mannie Jerel PARAS, NP       nicotine polacrilex (NICORETTE) gum 2 mg  2 mg Oral PRN Bennett, Christal H, NP   2 mg at 02/13/24 0922   OLANZapine (ZYPREXA) tablet 15 mg  15 mg Oral QHS Bennett, Christal H, NP   15 mg at 02/12/24 2123   traZODone (DESYREL) tablet 100 mg  100 mg Oral QHS Pranav Lince I, NP   100 mg at 02/12/24 2121   Lab Results:  No results found for this or any previous visit (from the past 48 hours).  Blood Alcohol level:  Lab Results  Component Value Date   [facility] <15 02/07/2024   ETH <10 11/10/2019   Metabolic Disorder Labs: Lab Results  Component Value Date    HGBA1C 5.7 (H) 02/08/2024   MPG 117 02/08/2024   No results found for: PROLACTIN Lab Results  Component Value Date   CHOL 149 02/08/2024   TRIG 76 02/08/2024   HDL 33 (L) 02/08/2024   CHOLHDL 4.6 02/08/2024   VLDL 15 02/08/2024   LDLCALC 101 (H) 02/08/2024   Physical Findings: AIMS:  , 0 ,  ,  ,  ,  ,   CIWA:   N/A COWS:   N/A  Musculoskeletal: Strength & Muscle Tone: within normal limits  Gait & Station: normal Patient leans: N/A  Psychiatric Specialty Exam:  Presentation  General Appearance:  Casual  Eye Contact: Fair  Speech: Clear and Coherent  Speech Volume: Increased  Handedness: Right  Mood and Affect  Mood: -- (Improving.)  Affect: Congruent   Thought Process  Thought Processes: Coherent  Descriptions of Associations:Intact  Orientation:Full (Time, Place and Person)  Thought Content:Logical  History of Schizophrenia/Schizoaffective disorder:No data recorded  Duration of Psychotic Symptoms:N/A   Hallucinations:Hallucinations: Auditory Description of Auditory Hallucinations: Tell them to watch me and come immediately.   Ideas of Reference:Paranoia; Percusatory; Delusions  Suicidal Thoughts:Suicidal Thoughts: No   Homicidal Thoughts:Homicidal Thoughts: No   Sensorium  Memory: Immediate Good; Recent Good; Remote Good  Judgment: Fair  Insight: Fair  Art Therapist  Concentration: Good  Attention Span:Good  Recall: Good  Fund of Knowledge: Fair  Language: Good  Psychomotor Activity  Psychomotor Activity: Psychomotor Activity: Normal    Assets  Assets: Communication Skills; Desire for Improvement; Resilience  Sleep  Sleep: Sleep: Good Number of Hours of Sleep: 9   Physical Exam: Physical Exam Vitals and nursing note reviewed.  HENT:     Mouth/Throat:     Pharynx: Oropharynx is clear.  Cardiovascular:     Rate and Rhythm: Normal rate.  Pulmonary:     Effort: No respiratory distress.   Genitourinary:    Comments: Deferred. Musculoskeletal:        General: Normal range of motion.  Skin:    General: Skin is dry.  Neurological:     Mental Status: He is alert and oriented to person, place, and time.    Review of Systems  Musculoskeletal:  Negative for joint pain.  Endo/Heme/Allergies:        NKDA.  Psychiatric/Behavioral:  Positive for depression, hallucinations and substance abuse. Negative for memory loss and suicidal ideas. The patient is nervous/anxious and has insomnia.   All other systems reviewed and are negative.  Blood pressure 137/88, pulse 79, temperature 97.6 F (36.4 C), temperature source Oral, resp. rate 16, height 6' (1.829 m), weight 119.1 kg, SpO2 97%. Body mass index is 35.61 kg/m.  Treatment Plan Summary: Daily contact with patient to assess and evaluate symptoms and progress in treatment and Medication management.   Continue inpatient hospitalization.  Will continue today 02/13/2024 plan as below except where it is noted.   Principal/active diagnoses.  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    Hx. Cocaine use disorder.  Plan: The risks/benefits/side-effects/alternatives to the medications in use were discussed in detail with the patient and time was given for patient's questions. The patient consents to medication trial.    Psychosis  - Continue olanzapine 15 mg oral nightly, paranoia, delusions  Depression - Continue fluoxetine 20 mg oral daily  - Continue Hydroxyzine 25 mg po tid prn for anxiety  Substance withdrawal syndrome  - Continue Gabapentin 200 mg oral 3 times daily.    Insomnia - Continue Trazodone 100 mg oral nightly.   BH Agitation protocol  - Continue as recommended (see MAR).   Medical:   - Continue Ensure plus 237 mL twice daily in between meals  - Discontinue Nicoderm patch due to repeated refusal  - Continue Nicorette gum 2 mg as  needed, nicotine dependence  - Continue Mucinex 600 mg oral twice daily x 5 days for cough.  - Continue Robitussin 5 mL every 6 hours as needed, cough     Safety and Monitoring: Voluntary admission  to inpatient psychiatric unit for safety, stabilization and treatment Daily contact with patient to assess and evaluate symptoms and progress in treatment Patient's case to be discussed in multi-disciplinary team meeting Observation Level : q15 minute checks Vital signs: q12 hours Precautions: Safety   Discharge Planning: Social work and case management to assist with discharge planning and identification of hospital follow-up needs prior to discharge Estimated LOS: 5-7 days Discharge Concerns: Need to establish a safety plan; Medication compliance and effectiveness Discharge Goals: Return home with outpatient referrals for mental health follow-up including medication management/psychotherapy  Mac Bolster, NP, pmhnp, fnp-bc. 02/13/2024, 4:12 PM Patient ID: Greely Atiyeh, male   DOB: 1970-07-30, 53 y.o.   MRN: [id number] Patient ID: Jeramia Saleeby, male   DOB: 01-Nov-1970, 53 y.o.   MRN: [id number] Patient ID: Alandis Bluemel, male   DOB: 01-Feb-1971, 53 y.o.   MRN: [id number] Patient ID: Ahmet Schank, male   DOB: 10/31/1970, 53 y.o.   MRN: [id number]

## 2024-02-13 NOTE — Plan of Care (Signed)

## 2024-02-13 NOTE — Group Note (Signed)
 LCSW Group Therapy Note   Group Date: 02/13/2024 Start Time: 1100 End Time: 1200   Participation:  patient was present.  He listened and was respectful but didn't participate in the discussion.    Type of Therapy:  Group Therapy  Topic:  Money Matters: Creating Stability, Confidence and Peace of Mind  Objective: To help participants understand the impact of financial stability on well-being through the lens of Maslow's Hierarchy of Needs and develop practical strategies for budgeting, saving, and debt repayment.  Goals: Increase awareness of spending habits and financial priorities, recognizing how money supports basic needs, security, and relationships. Develop simple budgeting and saving strategies to enhance stability and peace of mind.  Reduce financial stress by creating a realistic debt repayment plan, supporting long-term confidence and well-being.  Summary:  Participants explored how financial stability connects to basic needs, relationships, and self-esteem using Maslow's Hierarchy. They discussed budgeting, saving, and debt repayment strategies, identifying small, manageable changes. Through interactive discussion and self-reflection, they gained insight into their financial habits and created personal action steps for improvement.  Therapeutic Modalities Used: Elements of Cognitive Behavioral Therapy (CBT) - Addressing financial stress and thought patterns. Psychoeducation - Engineer, agricultural. Elements of Motivational Interviewing (MI) - Encouraging realistic, achievable changes. Group Support - Reducing shame and stress through shared experiences.   Joeanthony Seeling O Dhruvi Crenshaw, LCSWA 02/13/2024  12:27 PM

## 2024-02-13 NOTE — Group Note (Signed)
 Date:  02/13/2024 Time:  7:11 PM  Group Topic/Focus: Emotional Wellness  Emotional Education:   The focus of this group is to discuss what feelings/emotions are, and how they are experienced.   The group began with an emotional check-in and pulse check as an research scientist (life sciences). Patients then participated in an Emotion Bingo activity, where they were provided bingo cards listing various emotions. Emotions were called at random, and patients selected emotions to discuss how each feeling affects them and the actions or behaviors linked to that emotion. A gold star was awarded for bingo completion, and prompting questions such as "Have you ever experienced this emotion?" were used to encourage reflection and sharing. The group discussion highlighted the universality of emotions, the differences in how they may appear, and the importance of giving grace to others. In conclusion, patients were reminded that naming emotions increases awareness and provides the power to control how emotions are expressed.   Participation Level:  Did Not Attend    Tinnie LOISE Jim 02/13/2024, 7:11 PM

## 2024-02-14 MED ORDER — NICOTINE POLACRILEX 2 MG MT GUM: 2.0000 mg | CHEWING_GUM | OROMUCOSAL | Status: AC | PRN

## 2024-02-14 MED ORDER — TRAZODONE HCL 100 MG PO TABS: 100.0000 mg | ORAL_TABLET | Freq: Every day | ORAL | 0 refills | Status: DC

## 2024-02-14 MED ORDER — GABAPENTIN 100 MG PO CAPS: 200.0000 mg | ORAL_CAPSULE | Freq: Three times a day (TID) | ORAL | 0 refills | Status: DC

## 2024-02-14 MED ORDER — OLANZAPINE 15 MG PO TABS: 15.0000 mg | ORAL_TABLET | Freq: Every day | ORAL | 0 refills | Status: DC

## 2024-02-14 MED ORDER — FLUOXETINE HCL 20 MG PO CAPS: 20.0000 mg | ORAL_CAPSULE | Freq: Every day | ORAL | 0 refills | Status: DC

## 2024-02-14 NOTE — Progress Notes (Signed)
(  Sleep Hours) -7.25 as of 0530 (Any PRNs that were needed, meds refused, or side effects to meds)- none (Any disturbances and when (visitation, over night)-none (Concerns raised by the patient)- none (SI/HI/AVH)- denies all

## 2024-02-14 NOTE — Progress Notes (Signed)
 D: Pt A & O X 3. Denies SI, HI, AVH and pain at this time. D/C home as ordered. Taxi voucher given for transportation.  A: D/C instructions reviewed with pt including prescriptions, medication samples and follow up appointments; compliance encouraged. All belongings from locker 50 and green suitcase behind secure door returned to pt at time of departure. Scheduled medications given with verbal education and effects monitored. Safety checks maintained without incident till time of d/c.  R: Pt receptive to care. Compliant with medications when offered. Denies adverse drug reactions when assessed. Verbalized understanding related to d/c instructions. Signed belonging sheet in agreement with items received from locker. Ambulatory with a steady gait. Appears to be in no physical distress at time of departure.

## 2024-02-14 NOTE — Progress Notes (Signed)
  [facility] Adult Case Management Discharge Plan :  Will you be returning to the same living situation after discharge:  No. At discharge, do you have transportation home?: Yes,  CSW arranged [employer] taxi to the [facility] at [address] for 1130AM (same day discharge) Do you have the ability to pay for your medications: No.  Release of information consent forms completed and in the chart;  Patient's signature needed at discharge.  Patient to Follow up at:  Follow-up Information     [facility] Follow up.   Why: A referral has been made on your behalf to this program for substance use residential treatment. If you are interested in going to residential, please call to discuss your acceptance/admission date. Contact information: [address] [place] [address] [phone]         [facility]. Go on 02/20/2024.   Specialty: Behavioral Health Why: Please go to this provider on 02/20/24 at 7:00 am for an initial assessment, to obtain medication management services. You may also go Monday through Friday, arrive by 7:00 am for an assessment. Contact information: [address]   [phone]        [facility]. Go on 02/18/2024.   Specialty: Professional Counselor Why: Please go to this provider on 02/18/24 at 9:00 am to register for services. At this time, you will be scheduled for a clinical assessment, in order to obtain a therapy appointment. You may also go Monday thru Friday, from 9 am to 1 pm to register for services. Contact information: [address]  [address] [place] [address] [phone]                 Next level of care provider has access to [facility] Link:no  Safety Planning and Suicide Prevention discussed: Yes,   [name]- [phone] (employer/friend)     Has patient been referred to the Quitline?: Patient refused referral for  treatment  Patient has been referred for addiction treatment: Pt referred to substance use treatment through [facility] and given list of long term tx programs. Did not want door-to-door treatment.   [name], LCSWA 02/14/2024, 10:20 AM

## 2024-02-14 NOTE — Discharge Summary (Signed)
 Physician Discharge Summary Note  Patient:  Chad Greene is an 53 y.o., male MRN:  [id number] DOB:  1971-02-12 Patient phone:  [phone] (home)  Patient address:   [address] [place] [address] [place] [address],   Total Time spent with patient: 45 minutes  Date of Admission:  02/07/2024  Date of Discharge: 02-14-24  Reason for Admission: Complaints of drug induced paranoia & auditory hallucinations telling him to hurt other people.  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  Past Medical History:  Past Medical History:  Diagnosis Date   Lumbar vertebral fracture (HCC)    History reviewed. No pertinent surgical history.  Family History: History reviewed. No pertinent family history.  Family Psychiatric  History: See H&P.  Social History:  Social History   Substance and Sexual Activity  Alcohol Use Yes     Social History   Substance and Sexual Activity  Drug Use Yes   Types: Amphetamines   Comment: Last 3-4 years    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   Vaping status: Every Day   Substances: Nicotine , Flavoring  Substance and Sexual Activity   Alcohol use: Yes   Drug use: Yes    Types: Amphetamines    Comment: Last 3-4 years   Sexual activity: Yes    Birth control/protection: Condom  Other Topics Concern   Not on file  Social  History Narrative   Homeless   Social Drivers of Health   Financial Resource Strain: Not on file  Food Insecurity: No Food Insecurity (02/07/2024)   Hunger Vital Sign    Worried About Running Out of Food in the Last Year: Never true    Ran Out of Food in the Last Year: Never true  Transportation Needs: Unmet Transportation Needs (02/07/2024)   PRAPARE - Administrator, Civil Service (Medical): Yes    Lack of Transportation (Non-Medical): Yes  Physical Activity: Not on file  Stress: Not on file  Social Connections: Not on file   Hospital Course: (Per the admission evaluation notes): 53 year old Caucasian male with no known hx of mental illnesses, hospitalizations or treatments. Patient is admitted to the [facility] from the [facility] [place] hospital with complaints of drug induced paranoia & auditory hallucinations telling him to hurt other people. After medical evaluation/clearance, patient was recommended for inpatient psychiatric admission & was transferred to the [facility] for evaluation. A review of his current lab results has shown his UDS was positive for methamphetamine & THC. Patient is noted with some frequent coughing episodes.   Although with what seemed like an extensive hx of probable mental health issues & possible polysubstance use disorders, this is [name]'s first psychiatric admission/discharge summary from this [facility]. He was admitted with complaint of drug induced paranoia & auditory hallucinations telling him to hurt other people. He was recommended for mood stabilization treatments by his  treatment team after his admission evaluation. And with his consent, [name] was treated, stabilized & was discharged on the medications as listed below on his discharge medication lists. He was also enrolled & participated in the group counseling sessions being offered & held on this unit. He learned coping skills. Other than minor nonproductive cough of which he was treated with Mucinex , he presented  no other significant pre-existing medical conditions that required treatment or monitoring. He tolerated his treatment regimen without any adverse effects or reactions reported.  [name]'s symptoms responded well to his treatment regimen warranting this discharge. This is evidenced by his daily reports of improving symptoms up to toady being his discharge date. Upon his admission & during treatment plan, Halston was in agreement to be sent to a substance abuse treatment program upon discharge to learn ways to stay off drugs & other illegal substance. However, upon his request to be discharged today, [name] stated that he is no longer interested in going to a substance abuse treatment program. He stated that he has a place to go after discharge (his sister's or cousin's homes). However, when he tried to call both his sister & or his cousin, they were unable to answer the phone. [name] elected to be discharged to the [facility] (homeless shelter) instead.   During the course of his hospitalization, the 15-minute checks were adequate to ensure patient's safety. Patient did not display any dangerous, violent or suicidal behavior on the unit.  He interacted with the other patients & staff appropriately. He participated appropriately in the group sessions/therapies. His medications were addressed & adjusted to meet his needs. He was recommended for an outpatient follow-up care & medication management upon discharge to assure his continuity of care.  At the time of discharge, patient is not reporting any acute suicidal/homicidal ideations. He feels more confident about his self & mental health care. He currently denies any new issues or concerns. Education and supportive counseling provided throughout his hospital stay & upon discharge.   Today upon his discharge evaluation with his treatment team, [name] shares he is doing well. He denies any other specific concerns. He is sleeping well. His appetite is good. He denies other  physical complaints. He denies AH/VH, delusional thoughts or paranoia. He does not appear to be responding to any internal stimuli. He feels that his medications have been helpful & is in agreement to continue his current treatment regimen as recommended. He was able to engage in safety planning including plan to return to [facility] or contact emergency services if he feels unable to maintain his own safety or the safety of others. Pt had no further questions, comments, or concerns. He left [facility] with all personal belongings in no apparent distress. Transportation per taxi cab. [facility] assisted with taxi voucher.   Physical Findings: AIMS:  , ,  ,  ,  ,  ,   CIWA:    COWS:     Musculoskeletal: Strength & Muscle Tone: within normal limits Gait & Station: normal Patient leans: N/A  Psychiatric Specialty Exam:  Presentation  General Appearance:  Appropriate for Environment; Casual; Fairly Groomed  Eye Contact: Good  Speech: Clear and Coherent; Normal Rate  Speech Volume: Normal  Handedness: Right   Mood and Affect  Mood: Euthymic  Affect: Appropriate; Congruent   Thought Process  Thought Processes: Coherent; Linear; Goal Directed  Descriptions of Associations:Intact  Orientation:Full (Time, Place and Person)  Thought Content:Logical  History of Schizophrenia/Schizoaffective disorder:No  Duration of Psychotic Symptoms:N/A  Hallucinations:Hallucinations: None Description of Auditory Hallucinations: Patient denies.  Ideas of Reference:None (Pt reports his feeling of paranoia has improved.)  Suicidal Thoughts:Suicidal Thoughts: No  Homicidal Thoughts:Homicidal Thoughts: No   Sensorium  Memory: Immediate Good; Recent Good; Remote Good  Judgment: Fair  Insight: Fair  Art Therapist  Concentration: Good  Attention Span: Good  Recall: Good  Fund of Knowledge: Fair  Language: Good   Psychomotor Activity  Psychomotor Activity: Psychomotor  Activity: Normal   Assets  Assets: Communication Skills; Desire for Improvement; Resilience; Social Support; Physical Health   Sleep  Sleep: Sleep: Good  Estimated Sleeping Duration (Last 24 Hours): 6.00-7.25 hours  Physical Exam: Physical Exam Vitals and nursing note reviewed.  HENT:     Head: Normocephalic and atraumatic.     Nose: Nose normal.     Mouth/Throat:     Pharynx: Oropharynx is clear.  Cardiovascular:     Rate and Rhythm: Normal rate.     Pulses: Normal pulses.  Genitourinary:    Comments: Deferred. Musculoskeletal:        General: Normal range of motion.     Cervical back: Normal range of motion.  Skin:    General: Skin is dry.  Neurological:     General: No focal deficit present.     Mental Status: He is alert and oriented to person, place, and time. Mental status is at baseline.    Review of Systems  Constitutional:  Negative for chills, diaphoresis and fever.  HENT:  Negative for congestion and sore throat.   Respiratory:  Negative for cough (Hx of (stable on medication).), shortness of breath and wheezing.   Cardiovascular:  Negative for chest pain and palpitations.  Gastrointestinal:  Negative for abdominal pain, constipation, diarrhea, heartburn, nausea and vomiting.  Genitourinary:  Negative for dysuria.  Musculoskeletal:  Negative for joint pain (Hx of methamphenine use disorder.H xof paranoid ideations (stable).) and myalgias.  Skin:  Negative for itching and rash.  Neurological:  Negative for dizziness, tingling, tremors, sensory change, speech change, focal weakness, seizures, loss of consciousness, weakness and headaches.  Endo/Heme/Allergies:        Allergies: NKDA.  Psychiatric/Behavioral:  Positive for depression and substance abuse (Hx. of methamphetamine addiction.). Negative for hallucinations, memory loss and suicidal ideas. The patient is not nervous/anxious and does not have insomnia.    Blood pressure 125/87, pulse 77,  temperature 97.6 F (36.4 C), temperature source Oral, resp. rate 16, height 6' (1.829 m), weight 119.1 kg, SpO2 98%. Body mass index is 35.61 kg/m.   Social History   Tobacco Use  Smoking Status Every Day   Types: Cigarettes  Smokeless Tobacco Former   Tobacco Cessation:  An FDA-approved tobacco cessation medication recommended at discharge.  Blood Alcohol level:  Lab Results  Component Value Date   [facility] <15 02/07/2024   ETH <10 11/10/2019   Metabolic Disorder Labs:  Lab Results  Component Value Date   HGBA1C 5.7 (H) 02/08/2024   MPG 117 02/08/2024   No results found for: PROLACTIN Lab Results  Component Value Date   CHOL 149 02/08/2024   TRIG 76 02/08/2024   HDL 33 (L) 02/08/2024   CHOLHDL 4.6 02/08/2024   VLDL 15 02/08/2024   LDLCALC 101 (H) 02/08/2024   See Psychiatric Specialty Exam and Suicide Risk Assessment completed by Attending Physician prior to discharge.  Discharge destination:  [facility]  Is patient on multiple antipsychotic therapies at discharge:  No   Has Patient had three or more failed trials of  antipsychotic monotherapy by history:  No  Recommended Plan for Multiple Antipsychotic Therapies: NA Discharge Instructions     Diet - low sodium heart healthy   Complete by: As directed    Increase activity slowly   Complete by: As directed       Allergies as of 02/14/2024   No Known Allergies      Medication List     TAKE these medications      Indication  FLUoxetine  20 MG capsule Commonly known as: PROZAC  Take 1 capsule (20 mg total) by mouth daily. For depression. Start taking on: February 15, 2024  Indication: Depression   gabapentin  100 MG capsule Commonly known as: NEURONTIN  Take 2 capsules (200 mg total) by mouth 3 (three) times daily. Anxiety.  Indication: Anxiety.   multivitamin with minerals Tabs tablet Take 1 tablet by mouth daily.  Indication: Nutritional supplementation.   nicotine  polacrilex 2 MG gum Commonly known as:  NICORETTE  Take 1 each (2 mg total) by mouth as needed. (May buy from over the counter): For smoking cessation.  Indication: Nicotine  Addiction   OLANZapine  15 MG tablet Commonly known as: ZYPREXA  Take 1 tablet (15 mg total) by mouth at bedtime. For mood control  Indication: Mood control   traZODone  100 MG tablet Commonly known as: DESYREL  Take 1 tablet (100 mg total) by mouth at bedtime. For sleep.  Indication: Trouble Sleeping        Follow-up Information     Services, [facility] Follow up.   Why: A referral has been made on your behalf to this program for substance use residential treatment. If you are interested in going to residential, please call to discuss your acceptance/admission date. Contact information: [address] [place] [address] [phone]         [facility]. Go on 02/20/2024.   Specialty: Behavioral Health Why: Please go to this provider on 02/20/24 at 7:00 am for an initial assessment, to obtain medication management services. You may also go Monday through Friday, arrive by 7:00 am for an assessment. Contact information: [address] [place]  [phone]        [facility] Go on 02/18/2024.   Specialty: Professional Counselor Why: Please go to this provider on 02/18/24 at 9:00 am to register for services. At this time, you will be scheduled for a clinical assessment, in order to obtain a therapy appointment. You may also go Monday thru Friday, from 9 am to 1 pm to register for services. Contact information: [address] [place]  [address] [place] [address] [phone]                Plan Of Care/Follow-up recommendations:   Activity: as tolerated  Diet: heart healthy  Other: -Follow-up with your outpatient psychiatric provider -instructions on appointment date, time, and address (location) are provided to you in discharge paperwork.  -Take your  psychiatric medications as prescribed at discharge - instructions are provided to you in the discharge paperwork  -Follow-up with outpatient primary care doctor and other specialists -for management of preventative medicine and chronic medical issues  -Testing: Follow-up with outpatient provider for abnormal lab results: NA  -If you are prescribed an atypical antipsychotic medication, we recommend that your outpatient psychiatrist follow routine screening for side effects within 3 months of discharge, including monitoring: AIMS scale, height, weight, blood pressure, fasting lipid panel, HbA1c, and fasting blood sugar.   -Recommend total abstinence from alcohol, tobacco, and other  illicit drug use at discharge.   -If your psychiatric symptoms recur, worsen, or if you have side effects to your psychiatric medications, call your outpatient psychiatric provider, 911, [phone] or go to the nearest emergency department.  -If suicidal thoughts occur, immediately call your outpatient psychiatric provider, 911, [phone] or go to the nearest emergency department.   Signed: Mac Bolster, NP, pmhnp, fnp-bc. 02/14/2024, 10:56 AM

## 2024-02-14 NOTE — Transportation (Signed)
 02/14/2024  Chad Greene DOB: 25-Jul-1970 MRN: [id number]   RIDER WAIVER AND RELEASE OF LIABILITY  For the purposes of helping with transportation needs, [facility] partners with outside transportation providers (taxi companies, [place], catering manager.) to give [place] patients or other approved people the choice of on-demand rides Public Librarian) to our buildings for non-emergency visits.  By using [employer], I, the person signing this document, on behalf of myself and/or any legal minors (in my care using the [employer]), agree:  Science Writer given to me are supplied by independent, outside transportation providers who do not work for, or have any affiliation with, [employer]. [place] is not a transportation company. [facility] has no control over the quality or safety of the rides I get using [employer]. [facility] has no control over whether any outside ride will happen on time or not. [facility] gives no guarantee on the reliability, quality, safety, or availability on any rides, or that no mistakes will happen. I know and accept that traveling by vehicle (car, truck, SVU, fleeta, bus, taxi, etc.) has risks of serious injuries such as disability, being paralyzed, and death. I know and agree the risk of using [employer] is mine alone, and not [employer]. [employer] are provided as is and as are available. The transportation providers are in charge for all inspections and care of the vehicles used to provide these rides. I agree not to take legal action against [facility], its agents, employees, officers, directors, representatives, insurers, attorneys, assigns, successors, subsidiaries, and affiliates at any time for any reasons related directly or indirectly to using [employer]. I also agree not to take legal action against [place] or its affiliates for any injury, death, or damage to property caused by or related to using  [employer]. I have read this Waiver and Release of Liability, and I understand the terms used in it and their legal meaning. This Waiver is freely and voluntarily given with the understanding that my right (or any legal minors) to legal action against [place] relating to [employer] is knowingly given up to use these services.   I attest that I read the Ride Waiver and Release of Liability to Chad Greene, gave Mr. Alessio the opportunity to ask questions and answered the questions asked (if any). I affirm that Chad Greene then provided consent for assistance with transportation.

## 2024-02-14 NOTE — Group Note (Signed)
 Date:  02/14/2024 Time:  9:52 AM  Group Topic/Focus:  Goals Group:   The focus of this group is to help patients establish daily goals to achieve during treatment and discuss how the patient can incorporate goal setting into their daily lives to aide in recovery. Orientation:   The focus of this group is to educate the patient on the purpose and policies of crisis stabilization and provide a format to answer questions about their admission.  The group details unit policies and expectations of patients while admitted.    Participation Level:  Did Not Attend   Chad Greene 02/14/2024, 9:52 AM

## 2024-02-14 NOTE — [facility] Suicide Risk Assessment (Signed)
 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) Active Problems:   Methamphetamine-induced psychotic disorder (HCC)   Generalized anxiety disorder  Total Time spent with patient: 45 minutes  Musculoskeletal: Strength & Muscle Tone: within normal limits Gait & Station: normal Patient leans: N/A  Psychiatric Specialty Exam  Presentation  General Appearance:  Appropriate for Environment; Casual; Fairly Groomed  Eye Contact: Good  Speech: Clear and Coherent; Normal Rate  Speech Volume: Normal  Handedness: Right   Mood and Affect  Mood: Euthymic  Duration of Depression Symptoms: No data recorded Affect: Appropriate; Congruent   Thought Process  Thought Processes: Coherent; Linear; Goal Directed  Descriptions of Associations:Intact  Orientation:Full (Time, Place and Person)  Thought Content:Logical  History of Schizophrenia/Schizoaffective disorder:No  Duration of Psychotic Symptoms:N/A  Hallucinations:Hallucinations: None Description of Auditory Hallucinations: Patient denies.  Ideas of Reference:None (Pt reports his feeling of paranoia has improved.)  Suicidal Thoughts:Suicidal Thoughts: No  Homicidal Thoughts:Homicidal Thoughts: No   Sensorium  Memory: Immediate Good; Recent Good; Remote Good  Judgment: Fair  Insight: Fair   Art Therapist  Concentration: Good  Attention Span: Good  Recall: Good  Fund of Knowledge: Fair  Language: Good   Psychomotor Activity  Psychomotor Activity:Psychomotor Activity: Normal   Assets  Assets: Communication Skills; Desire for Improvement; Resilience; Social Support; Physical Health  Sleep  Sleep:Sleep:  Good  Estimated Sleeping Duration (Last 24 Hours): 6.00-7.25 hours  Physical Exam: See H&P.  Blood pressure 125/87, pulse 77, temperature 97.6 F (36.4 C), temperature source Oral, resp. rate 16, height 6' (1.829 m), weight 119.1 kg, SpO2 98%. Body mass index is 35.61 kg/m.  Mental Status Per Nursing Assessment::   On Admission:  Thoughts of violence towards others  Demographic Factors:  Male, Adolescent or young adult, Low socioeconomic status, and Unemployed  Loss Factors: Financial problems/change in socioeconomic status  Historical Factors: Family history of mental illness or substance abuse and Impulsivity  Risk Reduction Factors:   Sense of responsibility to family, Positive social support, Positive therapeutic relationship, and Positive coping skills or problem solving skills  Continued Clinical Symptoms:  Alcohol/Substance Abuse/Dependencies  Cognitive Features That Contribute To Risk:  Thought constriction (tunnel vision)    Suicide Risk:  Minimal: No identifiable suicidal ideation.  Patients presenting with no risk factors but with morbid ruminations; may be classified as minimal risk based on the severity of the depressive symptoms   Follow-up Information     [facility] Follow up.   Why: A referral has been made on your behalf to this program for substance use residential treatment. If you are interested in going to residential, please call to discuss your acceptance/admission date. Contact information: [address] [place] [address] [phone]         [facility]. Go on 02/20/2024.   Specialty: Behavioral Health Why: Please go to this provider on 02/20/24 at 7:00 am for an initial assessment, to obtain medication management services. You may also go Monday through Friday, arrive by 7:00 am for an assessment. Contact information: [address] [place]  [phone]         [facility] Go on 02/18/2024.   Specialty: Professional Counselor Why: Please go to this provider on 02/18/24 at 9:00  am to register for services. At this time, you will be scheduled for a clinical assessment, in order to obtain a therapy appointment. You may also go Monday thru Friday, from 9 am to 1 pm to register for services. Contact information: [address] [place]  [address] [place] [address] [phone]                Plan Of Care/Follow-up recommendations:  See the discharge recommendations above.  Mac Bolster, NP, pmhnp, fnp-bc. 02/14/2024, 10:40 AM

## 2024-02-14 NOTE — Group Note (Signed)
 Date:  02/14/2024 Time:  10:37 AM  Group Topic/Focus: Recreational Therapy    Pt did attend recreational therapy group   Dorissa Stinnette R Sri Clegg 02/14/2024, 10:37 AM

## 2024-02-21 ENCOUNTER — Encounter (HOSPITAL_COMMUNITY): Payer: Self-pay | Admitting: Physician Assistant

## 2024-02-21 ENCOUNTER — Ambulatory Visit (INDEPENDENT_AMBULATORY_CARE_PROVIDER_SITE_OTHER): Admitting: Physician Assistant

## 2024-02-21 VITALS — BP 136/80 | HR 73 | Temp 97.6°F | Ht 72.0 in | Wt 290.8 lb

## 2024-02-21 DIAGNOSIS — F411 Generalized anxiety disorder: Secondary | ICD-10-CM

## 2024-02-21 DIAGNOSIS — F15959 Other stimulant use, unspecified with stimulant-induced psychotic disorder, unspecified: Secondary | ICD-10-CM

## 2024-02-21 MED ORDER — FLUOXETINE HCL 40 MG PO CAPS: 40.0000 mg | ORAL_CAPSULE | Freq: Every day | ORAL | 2 refills | Status: AC

## 2024-02-21 MED ORDER — GABAPENTIN 100 MG PO CAPS: 200.0000 mg | ORAL_CAPSULE | Freq: Three times a day (TID) | ORAL | 2 refills | Status: AC

## 2024-02-21 MED ORDER — OLANZAPINE 15 MG PO TABS: 15.0000 mg | ORAL_TABLET | Freq: Every day | ORAL | 2 refills | Status: AC

## 2024-02-21 MED ORDER — TRAZODONE HCL 150 MG PO TABS: 150.0000 mg | ORAL_TABLET | Freq: Every day | ORAL | 2 refills | Status: AC

## 2024-02-21 NOTE — Progress Notes (Signed)
 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 Generalized anxiety disorder  F41.1 FLUoxetine  (PROZAC ) 40 MG capsule    gabapentin  (NEURONTIN ) 100 MG capsule      History of Present Illness:    Chad Lastinger. Greene is a 53 year old male with a past psychiatric history significant for methamphetamine induced psychotic disorder and generalized anxiety disorder who presents to [facility] to establish psychiatric care and for medication management.  Patient presents to the encounter stating that he was recently discharged from [facility].  He reports that he was hospitalized a day after Thanksgiving and discharged a week after.  Patient reports that he was hospitalized due to paranoia.  Per chart review, patient was admitted to [facility] on 02/07/2024 due to drug-induced paranoia accompanied by auditory hallucinations telling him to hurt other people.  Patient was discharged on 02/14/2024 on the following psychiatric medications:  Fluoxetine  20 mg daily Gabapentin  200 mg 3 times daily Olanzapine  15 mg at bedtime Trazodone  100 mg at bedtime  Since being discharged from the hospital, patient reports that he has been taking his medications regularly.  He reports that the medications have been helpful and denies experiencing any adverse side effects.  Patient endorses minimal depression and rates his depression a 5 out of 10 with 10 being most severe.  Patient endorses depressive episodes 2 to 3 days/week.  Patient endorses the following  depressive symptoms: feelings of sadness, lack of motivation, decreased concentration, decreased energy, irritability, feelings of guilt/worthlessness, and hopelessness.  Patient reports that his depression is worsened by being around people.  Patient denies any alleviating factors to his depression.  Patient reports that his anxiety has been manageable since being discharged on his current medication regimen.  The main trigger to his anxiety includes being in crowds.  Patient denies any stressors at this time.  Patient endorses panic attacks with his last panic attack experienced 2 to 3 days after Thanksgiving.  Patient denies any specific triggers to his panic attacks.  Patient's panic attacks is characterized by the following symptoms: elevated heart rate, shortness of breath, chest pain, lightheadedness, tremors, and sweating.  Patient endorses a past history of hospitalization due to mental health.  Patient was most recently hospitalized at [facility] on 02/07/2024.  Patient denies a past history of suicide attempt.  A PHQ-9 screen was performed with the patient scoring a 21.  A GAD-7 screen was also performed with the patient scoring a 21.  Patient is alert and oriented x 4, calm, cooperative, and fully engaged in conversation during the encounter.  Patient endorses fair mood.  Patient exhibits stable mood with appropriate affect.  Patient denies suicidal or homicidal ideations.  He further denies auditory or visual hallucinations and does not appear to be responding to internal/external stimuli.  Patient denies paranoia or delusional thoughts.  Patient endorses poor sleep and receives on average 3 to 4 hours of sleep per night.  Patient endorses good appetite and eats on average 3 meals per day.  Patient denies alcohol consumption.  Patient endorses tobacco use and smokes  on average 10 to 15 cigarettes/day.  Patient denies active illicit drug use at this time.  Associated Signs/Symptoms: Depression  Symptoms:  depressed mood, anhedonia, hypersomnia, psychomotor agitation, psychomotor retardation, fatigue, difficulty concentrating, impaired memory, anxiety, panic attacks, loss of energy/fatigue, disturbed sleep, weight gain, increased appetite, (Hypo) Manic Symptoms:  Delusions, Distractibility, Elevated Mood, Grandiosity, Hallucinations, Impulsivity, Irritable Mood, Labiality of Mood, Anxiety Symptoms:  Agoraphobia, Excessive Worry, Panic Symptoms, Obsessive Compulsive Symptoms:   Checking Counting Handwashing, Social Anxiety, Specific Phobias, Psychotic Symptoms:  Delusions, Hallucinations: Auditory Paranoia, PTSD Symptoms: Negative  Past Psychiatric History:  Patient has a past psychiatric history significant for generalized anxiety disorder and methamphetamine-induced psychotic disorder.  Patient endorses a past history of hospitalization due to mental health.  Patient has only ever been hospitalized once which occurred at [facility] from 02/07/2024 to 02/14/2024.  Patient denies a past history of suicide attempts.  Patient denies a past history of homicide attempt.  Previous Psychotropic Medications: No   Substance Abuse History in the last 12 months:  Yes.    Consequences of Substance Abuse: Patient reports that he last used methamphetamine the night before Thanksgiving prior to his most recent hospitalization.  Patient reports that he used cocaine 3 to 4 months ago.  Patient states that he last used marijuana 2 days ago.  Medical Consequences:  Patient denies Legal Consequences:  Patient denies Family Consequences:  Patient denies Blackouts:  Patient endorses a past history of blacking out DT's: Patient denies Withdrawal Symptoms:   Patient endorses withdrawal symptoms from use of meth  Past Medical History:  Past Medical History:  Diagnosis Date   Lumbar vertebral fracture (HCC)    History reviewed. No pertinent surgical  history.  Family Psychiatric History:  Mother - suffered from some form of mental health Sister - suffered from some form of mental health  Family history of suicide attempt: Patient denies Family history of homicide attempt: Patient denies Family history of substance abuse: Patient reports that his mother and father uses marijuana, alcohol, and cocaine.  Family History: History reviewed. No pertinent family history.  Social History:   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   Vaping status: Every Day   Substances: Nicotine , Flavoring  Substance and Sexual Activity   Alcohol use: Yes   Drug use: Yes    Types: Amphetamines    Comment: Last 3-4 years   Sexual activity: Yes    Birth control/protection: Condom  Other Topics Concern   Not on file  Social History Narrative   Homeless   Social Drivers of Health   Tobacco Use: High Risk (02/21/2024)   Patient History    Smoking Tobacco Use: Every Day    Smokeless Tobacco Use: Former    Passive Exposure: Not on Actuary Strain: Not on file  Food Insecurity: No Food Insecurity (02/07/2024)   Epic    Worried About Radiation Protection Practitioner of Food in the Last Year: Never true    Ran Out of Food in the Last Year: Never true  Transportation Needs: Unmet Transportation Needs (02/07/2024)   Epic  Lack of Transportation (Medical): Yes    Lack of Transportation (Non-Medical): Yes  Physical Activity: Not on file  Stress: Not on file  Social Connections: Not on file  Depression (PHQ2-9): High Risk (02/21/2024)   Depression (PHQ2-9)    PHQ-2 Score: 21  Alcohol Screen: Low Risk (02/07/2024)   Alcohol Screen    Last Alcohol  Screening Score (AUDIT): 5  Housing: High Risk (02/07/2024)   Epic    Unable to Pay for Housing in the Last Year: Yes    Number of Times Moved in the Last Year: 0    Homeless in the Last Year: Yes  Utilities: At Risk (02/07/2024)   Epic    Threatened with loss of utilities: Already shut off  Health Literacy: Not on file    Additional Social History:  Patient endorses social support.  Patient endorses having children.  Patient denies housing.  Patient is currently employed.  Patient denies a past history of military experience.  Patient endorses a past history of prison time from 2004-2016.  Highest education completed by the patient was the eighth grade but states that he does have his GED.  Patient denies access to weapons.  Allergies:  Allergies[1]  Metabolic Disorder Labs: Lab Results  Component Value Date   HGBA1C 5.7 (H) 02/08/2024   MPG 117 02/08/2024   No results found for: PROLACTIN Lab Results  Component Value Date   CHOL 149 02/08/2024   TRIG 76 02/08/2024   HDL 33 (L) 02/08/2024   CHOLHDL 4.6 02/08/2024   VLDL 15 02/08/2024   LDLCALC 101 (H) 02/08/2024   No results found for: TSH  Therapeutic Level Labs: No results found for: LITHIUM No results found for: CBMZ No results found for: VALPROATE  Current Medications: Current Outpatient Medications  Medication Sig Dispense Refill   FLUoxetine  (PROZAC ) 40 MG capsule Take 1 capsule (40 mg total) by mouth daily. For depression. 30 capsule 2   gabapentin  (NEURONTIN ) 100 MG capsule Take 2 capsules (200 mg total) by mouth 3 (three) times daily. Anxiety. 180 capsule 2   Multiple Vitamin (MULTIVITAMIN WITH MINERALS) TABS tablet Take 1 tablet by mouth daily.     nicotine  polacrilex (NICORETTE ) 2 MG gum Take 1 each (2 mg total) by mouth as needed. (May buy from over the counter): For smoking cessation.     OLANZapine  (ZYPREXA ) 15 MG tablet Take 1 tablet (15 mg total) by mouth at bedtime. For mood control 30 tablet  2   traZODone  (DESYREL ) 150 MG tablet Take 1 tablet (150 mg total) by mouth at bedtime. For sleep. 30 tablet 2   No current facility-administered medications for this visit.    Musculoskeletal: Strength & Muscle Tone: within normal limits Gait & Station: normal Patient leans: N/A  Psychiatric Specialty Exam: Review of Systems  Psychiatric/Behavioral:  Positive for dysphoric mood and sleep disturbance. Negative for decreased concentration, hallucinations, self-injury and suicidal ideas. The patient is nervous/anxious. The patient is not hyperactive.     Blood pressure 136/80, pulse 73, temperature 97.6 F (36.4 C), temperature source Oral, height 6' (1.829 m), weight 290 lb 12.8 oz (131.9 kg), SpO2 97%.Body mass index is 39.44 kg/m.  General Appearance: Casual  Eye Contact:  Good  Speech:  Clear and Coherent and Normal Rate  Volume:  Normal  Mood:  Anxious and Depressed  Affect:  Congruent  Thought Process:  Coherent, Goal Directed, and Descriptions of Associations: Intact  Orientation:  Full (Time, Place, and Person)  Thought Content:  WDL  Suicidal Thoughts:  No  Homicidal Thoughts:  No  Memory:  Immediate;   Good Recent;   Good Remote;   Good  Judgement:  Good  Insight:  Good  Psychomotor Activity:  Normal  Concentration:  Concentration: Good and Attention Span: Good  Recall:  Good  Fund of Knowledge:Good  Language: Good  Akathisia:  No  Handed:  Right  AIMS (if indicated):  not done  Assets:  Communication Skills Desire for Improvement Financial Resources/Insurance Housing Social Support Vocational/Educational  ADL's:  Intact  Cognition: WNL  Sleep:  Poor   Screenings: AUDIT    Flowsheet Row [facility]  Alcohol Use Disorder Identification Test Final Score (AUDIT) 5   GAD-7    Flowsheet Row Office Visit from 02/21/2024 in [facility]  Total GAD-7  Score 21   PHQ2-9    Flowsheet Row Office Visit from 02/21/2024 in [facility]  PHQ-2 Total Score 4  PHQ-9 Total Score 21   Flowsheet Row Office Visit from 02/21/2024 in [facility] Most recent reading at 02/21/2024  8:27 AM Ad[facility] ([facility] Most recent reading at 02/07/2024  9:48 PM ED from 02/07/2024 in [facility] at [facility] Most recent reading at 02/07/2024 10:59 AM  C-SSRS RISK CATEGORY No Risk No Risk No Risk    Assessment and Plan:   Chad Greene. Chad Greene is a 53 year old male with a past psychiatric history significant for methamphetamine induced psychotic disorder and generalized anxiety disorder who presents to [facility] to establish psychiatric care and for medication management.  Patient presents to the encounter stating that he was recently discharged from [facility]. Per chart review, patient was admitted to [facility] on 02/07/2024 due to drug-induced paranoia accompanied by auditory hallucinations telling him to hurt other people.  Patient was discharged on 02/14/2024 on the following psychiatric medications:  Fluoxetine  20 mg daily Gabapentin  200 mg 3 times daily Olanzapine  15 mg at bedtime Trazodone  100 mg at bedtime  Since being discharged from the hospital, patient reports that he has been taking his medications regularly and denies experiencing any adverse side effects.  He denies experiencing any rigidity, stiffness, or involuntary movements while being on olanzapine .  Patient reports that his depression has been minimal since being on his current medication regimen.  He also states that his anxiety has been manageable.  A PHQ-9 screen was performed with the patient scoring a 21.  A GAD-7 screen was also performed with the patient scoring a 21.  Patient  reports that his mood has been stable but he still continues to experience poor sleep.  Provider recommended patient increase his trazodone  dosage from 100 mg to 150 mg at bedtime for the management of his sleep.  Though patient reports that his depression has been minimal, he scored a 21 on his PHQ-9 screen.  Provider recommended increasing his fluoxetine  dosage from 20 mg to 40 mg daily for the management of his depressive symptoms and anxiety.  Patient was agreeable to recommendations.  Patient medications to be e-prescribed to pharmacy of choice.  A [place] Suicide Severity Rating Scale was performed with the patient being considered no risk.  Patient denies suicidal ideations and is able to contract for safety at this time.    Collaboration of Care: Medication Management AEB provider is managing patient's  psychiatric medications and Psychiatrist AEB patient being followed by a mental health provider  Patient/Guardian was advised Release of Information must be obtained prior to any record release in order to collaborate their care with an outside provider. Patient/Guardian was advised if they have not already done so to contact the registration department to sign all necessary forms in order for us  to release information regarding their care.   Consent: Patient/Guardian gives verbal consent for treatment and assignment of benefits for services provided during this visit. Patient/Guardian expressed understanding and agreed to proceed.   1. Methamphetamine-induced psychotic disorder (HCC) (Primary)  - traZODone  (DESYREL ) 150 MG tablet; Take 1 tablet (150 mg total) by mouth at bedtime. For sleep.  Dispense: 30 tablet; Refill: 2 - OLANZapine  (ZYPREXA ) 15 MG tablet; Take 1 tablet (15 mg total) by mouth at bedtime. For mood control  Dispense: 30 tablet; Refill: 2  2. Generalized anxiety disorder  - FLUoxetine  (PROZAC ) 40 MG capsule; Take 1 capsule (40 mg total) by mouth daily. For depression.   Dispense: 30 capsule; Refill: 2 - gabapentin  (NEURONTIN ) 100 MG capsule; Take 2 capsules (200 mg total) by mouth 3 (three) times daily. Anxiety.  Dispense: 180 capsule; Refill: 2  Patient to follow up in 6 weeks with Daniela B. Izella, MD Provider spent a total of 45 minutes with the patient/reviewing patient's chart  Reginia FORBES Bolster, PA 12/12/20259:57 AM     [1] No Known Allergies  "

## 2024-03-02 ENCOUNTER — Ambulatory Visit (HOSPITAL_COMMUNITY): Admitting: Psychiatry

## 2024-03-09 ENCOUNTER — Ambulatory Visit (HOSPITAL_COMMUNITY): Admitting: Psychiatry

## 2024-03-16 NOTE — Progress Notes (Unsigned)
 " Psychiatric Adult Assessment Progress Note  Patient Identification: [facility] MRN:  [id number] Date of Evaluation:  03/16/2024   Assessment and Plan:  Patient presents to the encounter stating that he was recently discharged from [facility] and initially seen by Chad Bolster PA. Per chart review, patient was admitted to [facility] on 02/07/2024 due to drug-induced paranoia accompanied by auditory hallucinations telling him to hurt other people.    In the prior appointment, the following changes were made: increase trazodone  dosage to 150 mg at bedtime, increasing his fluoxetine  dosage to 40 mg daily for the management of his depressive symptoms and anxiety.  Today, ***  Identifying Information: [facility]. [name] is a 54 year old male with a past psychiatric history significant for methamphetamine induced psychotic disorder and generalized anxiety disorder who presents to [facility] to establish psychiatric care and for medication management.  # Methamphetamine-induced psychotic disorder  - Zyprexa  15 mg tablet nightly - Trazodone  150 mg tablet nightly PRN   # Generalized anxiety disorder - Prozac  40 mg daily - Gabapentin  200 mg TID  Identifying Information:  [facility] is a 54 year old male with a past psychiatric history significant for methamphetamine induced psychotic disorder and generalized anxiety disorder who presents to [facility] to establish psychiatric care and for medication management. Previously seen initially by Chad Bolster PA.   Per chart review, patient was admitted [facility] on 02/07/2024 due to drug-induced paranoia accompanied by auditory hallucinations telling him to hurt other people.  Patient was discharged on 02/14/2024 on the following psychiatric medications:  Fluoxetine  20 mg daily Gabapentin  200 mg 3 times daily Olanzapine  15 mg  at bedtime Trazodone  100 mg at bedtime  Patient seen ***.  Patient reports feeling *** today. Since the previous visit, ***. Stressors include ***.   Regarding psychiatric symptoms, ***. Patient reports the medications are ***. Patient reports the following adverse effects: ***.   Patient reports *** sleep, ***. Patient reports *** appetite, ***.   Patient denies current SI, HI, and AVH. ***  Substance use: *** Tobacco: *** Alcohol: *** Illicit substances: ***   Past Psychiatric History:  Patient has a past psychiatric history significant for generalized anxiety disorder and methamphetamine-induced psychotic disorder.  Patient endorses a past history of hospitalization due to mental health.  Patient has only ever been hospitalized once which occurred at [facility] from 02/07/2024 to 02/14/2024.  Patient denies a past history of suicide attempts.  Patient denies a past history of homicide attempt.  Previous Psychotropic Medications: No   Substance Abuse History in the last 12 months:  Yes.    Consequences of Substance Abuse: Patient reports that he last used methamphetamine 01/2024 Used cocaine 11/2023 Patient states that he last used marijuana 02/2024  Medical Consequences:  Patient denies Legal Consequences:  Patient denies Family Consequences:  Patient denies Blackouts:  Patient endorses a past history of blacking out DT's: Patient denies Withdrawal Symptoms:   Patient endorses withdrawal symptoms from use of meth  Past Medical History:  Past Medical History:  Diagnosis Date   Lumbar vertebral fracture (HCC)    No past surgical history on file.  Family Psychiatric History:  Mother - suffered from some form of mental health Sister - suffered from some form of mental health  Family history of suicide attempt: Patient denies Family history of homicide attempt: Patient denies Family history of substance abuse: Patient reports that his  mother  and father uses marijuana, alcohol, and cocaine.   Social History:   Patient endorses social support.   Patient endorses having children.   Patient denies housing.   Patient is currently employed.   Patient denies a past history of military experience.   Patient endorses a past history of prison time from 2004-2016.   Highest education completed by the patient was the eighth grade but states that he does have his GED.   Patient denies access to weapons.  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   Vaping status: Every Day   Substances: Nicotine , Flavoring  Substance and Sexual Activity   Alcohol use: Yes   Drug use: Yes    Types: Amphetamines    Comment: Last 3-4 years   Sexual activity: Yes    Birth control/protection: Condom  Other Topics Concern   Not on file  Social History Narrative   Homeless   Social Drivers of Health   Tobacco Use: High Risk (02/21/2024)   Patient History    Smoking Tobacco Use: Every Day    Smokeless Tobacco Use: Former    Passive Exposure: Not on Actuary Strain: Not on file  Food Insecurity: No Food Insecurity (02/07/2024)   Epic    Worried About Programme Researcher, Broadcasting/film/video in the Last Year: Never true    Ran Out of Food in the Last Year: Never true  Transportation Needs: Unmet Transportation Needs (02/07/2024)   Epic    Lack of Transportation (Medical): Yes    Lack of Transportation (Non-Medical): Yes  Physical Activity: Not on file  Stress: Not on file  Social Connections: Not on file  Depression (PHQ2-9): High Risk (02/21/2024)   Depression (PHQ2-9)    PHQ-2 Score: 21  Alcohol Screen: Low Risk (02/07/2024)   Alcohol Screen     Last Alcohol Screening Score (AUDIT): 5  Housing: High Risk (02/07/2024)   Epic    Unable to Pay for Housing in the Last Year: Yes    Number of Times Moved in the Last Year: 0    Homeless in the Last Year: Yes  Utilities: At Risk (02/07/2024)   Epic    Threatened with loss of utilities: Already shut off  Health Literacy: Not on file    Allergies:  Allergies[1]  Metabolic Disorder Labs: Lab Results  Component Value Date   HGBA1C 5.7 (H) 02/08/2024   MPG 117 02/08/2024   No results found for: PROLACTIN Lab Results  Component Value Date   CHOL 149 02/08/2024   TRIG 76 02/08/2024   HDL 33 (L) 02/08/2024   CHOLHDL 4.6 02/08/2024   VLDL 15 02/08/2024   LDLCALC 101 (H) 02/08/2024   No results found for: TSH  Therapeutic Level Labs: No results found for: LITHIUM No results found for: CBMZ No results found for: VALPROATE  Current Medications: Current Outpatient Medications  Medication Sig Dispense Refill   FLUoxetine  (PROZAC ) 40 MG capsule Take 1 capsule (40 mg total) by mouth daily. For depression. 30 capsule 2   gabapentin  (NEURONTIN ) 100 MG capsule Take 2 capsules (200 mg total) by mouth  3 (three) times daily. Anxiety. 180 capsule 2   Multiple Vitamin (MULTIVITAMIN WITH MINERALS) TABS tablet Take 1 tablet by mouth daily.     nicotine  polacrilex (NICORETTE ) 2 MG gum Take 1 each (2 mg total) by mouth as needed. (May buy from over the counter): For smoking cessation.     OLANZapine  (ZYPREXA ) 15 MG tablet Take 1 tablet (15 mg total) by mouth at bedtime. For mood control 30 tablet 2   traZODone  (DESYREL ) 150 MG tablet Take 1 tablet (150 mg total) by mouth at bedtime. For sleep. 30 tablet 2   No current facility-administered medications for this visit.    Psychiatric Specialty Exam: General Appearance: appears at stated age, casually dressed and groomed ***  Behavior: pleasant and cooperative ***  Psychomotor Activity: no psychomotor agitation or retardation  noted ***  Eye Contact: fair *** Speech: normal amount, volume and fluency ***   Mood: euthymic *** Affect: congruent, pleasant and interactive ***  Thought Process: linear, goal directed, no circumstantial or tangential thought process noted, no racing thoughts or flight of ideas *** Descriptions of Associations: intact ***  Thought Content Hallucinations: denies AH, VH , does not appear responding to stimuli *** Delusions: no paranoia, delusions of control, grandeur, ideas of reference, thought broadcasting, and magical thinking *** Suicidal Thoughts: denies SI, intention, plan *** Homicidal Thoughts: denies HI, intention, plan ***  Alertness/Orientation: alert and fully oriented ***  Insight: fair*** Judgment: fair***  Memory: intact ***  Executive Functions  Concentration: intact *** Attention Span: fair *** Recall: intact *** Fund of Knowledge: fair ***  Physical Exam *** General: Pleasant, well-appearing. No acute distress. Pulmonary: Normal effort. No wheezing or rales. Skin: No obvious rash or lesions.*** Neuro: A&Ox3.No focal deficit.  Review of Systems *** No reported symptoms  Screenings: AUDIT    Flowsheet Row [facility]  Alcohol Use Disorder Identification Test Final Score (AUDIT) 5   GAD-7    Flowsheet Row Office Visit from 02/21/2024 in [facility]  Total GAD-7 Score 21   PHQ2-9    Flowsheet Row Office Visit from 02/21/2024 in [facility]  PHQ-2 Total Score 4  PHQ-9 Total Score 21   Flowsheet Row Office Visit from 02/21/2024 in [facility] Most recent reading at 02/21/2024  8:27 AM Admission (Discharged) [facility] Most recent reading at 02/07/2024  9:48 PM ED from 02/07/2024 in [facility] Most recent reading at 02/07/2024 10:59 AM  C-SSRS RISK CATEGORY No Risk No Risk No Risk     Ismael Franco, MD PGY-3 Psychiatry Resident       [1] No Known Allergies  "

## 2024-03-27 ENCOUNTER — Encounter (HOSPITAL_COMMUNITY): Payer: Self-pay

## 2024-03-27 ENCOUNTER — Encounter (HOSPITAL_COMMUNITY): Admitting: Psychiatry

## 2024-04-12 DEATH — deceased
